# Patient Record
Sex: Female | Born: 1965 | Race: Black or African American | Hispanic: No | Marital: Single
Health system: Midwestern US, Community
[De-identification: ages and names within clinical notes are randomized; demographics above are authoritative.]

## PROBLEM LIST (undated history)

## (undated) DIAGNOSIS — Z1231 Encounter for screening mammogram for malignant neoplasm of breast: Secondary | ICD-10-CM

## (undated) DIAGNOSIS — E0590 Thyrotoxicosis, unspecified without thyrotoxic crisis or storm: Secondary | ICD-10-CM

## (undated) DIAGNOSIS — F332 Major depressive disorder, recurrent severe without psychotic features: Secondary | ICD-10-CM

## (undated) DIAGNOSIS — G4700 Insomnia, unspecified: Secondary | ICD-10-CM

## (undated) DIAGNOSIS — F333 Major depressive disorder, recurrent, severe with psychotic symptoms: Secondary | ICD-10-CM

## (undated) DIAGNOSIS — M25569 Pain in unspecified knee: Secondary | ICD-10-CM

## (undated) DIAGNOSIS — Z1239 Encounter for other screening for malignant neoplasm of breast: Secondary | ICD-10-CM

---

## 1998-02-19 ENCOUNTER — Encounter: Admission: RE | Admit: 1998-02-19 | Discharge: 1998-02-19 | Payer: Self-pay | Admitting: Family Medicine

## 1998-04-24 ENCOUNTER — Inpatient Hospital Stay (HOSPITAL_COMMUNITY): Admission: EM | Admit: 1998-04-24 | Discharge: 1998-04-30 | Payer: Self-pay | Admitting: Emergency Medicine

## 1998-06-11 ENCOUNTER — Encounter: Admission: RE | Admit: 1998-06-11 | Discharge: 1998-09-09 | Payer: Self-pay | Admitting: Specialist

## 1998-07-10 ENCOUNTER — Encounter: Admission: RE | Admit: 1998-07-10 | Discharge: 1998-07-10 | Payer: Self-pay | Admitting: Family Medicine

## 1998-10-08 ENCOUNTER — Encounter: Admission: RE | Admit: 1998-10-08 | Discharge: 1998-10-08 | Payer: Self-pay | Admitting: Family Medicine

## 1998-12-01 ENCOUNTER — Encounter: Admission: RE | Admit: 1998-12-01 | Discharge: 1998-12-01 | Payer: Self-pay | Admitting: Sports Medicine

## 1998-12-16 ENCOUNTER — Encounter: Admission: RE | Admit: 1998-12-16 | Discharge: 1998-12-16 | Payer: Self-pay | Admitting: Family Medicine

## 1998-12-23 ENCOUNTER — Encounter: Admission: RE | Admit: 1998-12-23 | Discharge: 1998-12-23 | Payer: Self-pay | Admitting: Family Medicine

## 1999-01-03 ENCOUNTER — Emergency Department (HOSPITAL_COMMUNITY): Admission: EM | Admit: 1999-01-03 | Discharge: 1999-01-03 | Payer: Self-pay | Admitting: Emergency Medicine

## 1999-01-14 ENCOUNTER — Encounter: Admission: RE | Admit: 1999-01-14 | Discharge: 1999-01-14 | Payer: Self-pay | Admitting: Unknown Physician Specialty

## 1999-01-15 ENCOUNTER — Encounter: Admission: RE | Admit: 1999-01-15 | Discharge: 1999-01-15 | Payer: Self-pay | Admitting: Family Medicine

## 1999-02-03 ENCOUNTER — Encounter: Admission: RE | Admit: 1999-02-03 | Discharge: 1999-02-03 | Payer: Self-pay | Admitting: Family Medicine

## 1999-02-23 ENCOUNTER — Encounter: Admission: RE | Admit: 1999-02-23 | Discharge: 1999-02-23 | Payer: Self-pay | Admitting: Sports Medicine

## 1999-03-05 ENCOUNTER — Encounter: Admission: RE | Admit: 1999-03-05 | Discharge: 1999-03-05 | Payer: Self-pay | Admitting: Family Medicine

## 1999-03-24 ENCOUNTER — Encounter: Admission: RE | Admit: 1999-03-24 | Discharge: 1999-03-24 | Payer: Self-pay | Admitting: Family Medicine

## 1999-05-21 ENCOUNTER — Encounter: Admission: RE | Admit: 1999-05-21 | Discharge: 1999-05-21 | Payer: Self-pay | Admitting: Family Medicine

## 1999-06-02 ENCOUNTER — Encounter: Admission: RE | Admit: 1999-06-02 | Discharge: 1999-06-02 | Payer: Self-pay | Admitting: Family Medicine

## 1999-06-14 ENCOUNTER — Encounter: Admission: RE | Admit: 1999-06-14 | Discharge: 1999-06-14 | Payer: Self-pay | Admitting: Family Medicine

## 1999-08-19 ENCOUNTER — Encounter: Admission: RE | Admit: 1999-08-19 | Discharge: 1999-08-19 | Payer: Self-pay | Admitting: Family Medicine

## 1999-08-20 ENCOUNTER — Encounter: Admission: RE | Admit: 1999-08-20 | Discharge: 1999-08-20 | Payer: Self-pay | Admitting: Family Medicine

## 1999-11-05 ENCOUNTER — Encounter: Admission: RE | Admit: 1999-11-05 | Discharge: 1999-11-05 | Payer: Self-pay | Admitting: Family Medicine

## 1999-11-05 ENCOUNTER — Other Ambulatory Visit: Admission: RE | Admit: 1999-11-05 | Discharge: 1999-11-05 | Payer: Self-pay | Admitting: Family Medicine

## 1999-11-12 ENCOUNTER — Encounter: Admission: RE | Admit: 1999-11-12 | Discharge: 1999-11-12 | Payer: Self-pay | Admitting: Family Medicine

## 1999-12-16 ENCOUNTER — Emergency Department (HOSPITAL_COMMUNITY): Admission: EM | Admit: 1999-12-16 | Discharge: 1999-12-16 | Payer: Self-pay | Admitting: Emergency Medicine

## 2000-02-12 ENCOUNTER — Inpatient Hospital Stay (HOSPITAL_COMMUNITY): Admission: AD | Admit: 2000-02-12 | Discharge: 2000-02-12 | Payer: Self-pay | Admitting: *Deleted

## 2000-04-16 ENCOUNTER — Emergency Department (HOSPITAL_COMMUNITY): Admission: EM | Admit: 2000-04-16 | Discharge: 2000-04-16 | Payer: Self-pay | Admitting: Emergency Medicine

## 2000-11-22 ENCOUNTER — Encounter: Admission: RE | Admit: 2000-11-22 | Discharge: 2000-11-22 | Payer: Self-pay | Admitting: Family Medicine

## 2001-02-21 ENCOUNTER — Encounter: Admission: RE | Admit: 2001-02-21 | Discharge: 2001-02-21 | Payer: Self-pay | Admitting: Family Medicine

## 2001-03-27 ENCOUNTER — Encounter: Admission: RE | Admit: 2001-03-27 | Discharge: 2001-03-27 | Payer: Self-pay | Admitting: Family Medicine

## 2001-03-27 ENCOUNTER — Other Ambulatory Visit: Admission: RE | Admit: 2001-03-27 | Discharge: 2001-03-27 | Payer: Self-pay | Admitting: *Deleted

## 2001-04-25 ENCOUNTER — Emergency Department (HOSPITAL_COMMUNITY): Admission: EM | Admit: 2001-04-25 | Discharge: 2001-04-25 | Payer: Self-pay | Admitting: Emergency Medicine

## 2001-05-23 ENCOUNTER — Encounter: Admission: RE | Admit: 2001-05-23 | Discharge: 2001-05-23 | Payer: Self-pay | Admitting: Family Medicine

## 2001-08-20 ENCOUNTER — Encounter: Admission: RE | Admit: 2001-08-20 | Discharge: 2001-08-20 | Payer: Self-pay | Admitting: Family Medicine

## 2001-08-31 ENCOUNTER — Encounter: Admission: RE | Admit: 2001-08-31 | Discharge: 2001-08-31 | Payer: Self-pay | Admitting: Family Medicine

## 2001-09-14 ENCOUNTER — Observation Stay (HOSPITAL_COMMUNITY): Admission: EM | Admit: 2001-09-14 | Discharge: 2001-09-14 | Payer: Self-pay

## 2001-09-14 ENCOUNTER — Encounter: Payer: Self-pay | Admitting: Emergency Medicine

## 2001-09-20 ENCOUNTER — Encounter: Admission: RE | Admit: 2001-09-20 | Discharge: 2001-09-20 | Payer: Self-pay | Admitting: Family Medicine

## 2001-10-08 ENCOUNTER — Encounter: Admission: RE | Admit: 2001-10-08 | Discharge: 2001-10-08 | Payer: Self-pay | Admitting: Family Medicine

## 2001-10-16 ENCOUNTER — Encounter: Admission: RE | Admit: 2001-10-16 | Discharge: 2001-10-16 | Payer: Self-pay | Admitting: Sports Medicine

## 2001-10-16 ENCOUNTER — Ambulatory Visit (HOSPITAL_COMMUNITY): Admission: RE | Admit: 2001-10-16 | Discharge: 2001-10-16 | Payer: Self-pay | Admitting: Sports Medicine

## 2001-11-19 ENCOUNTER — Encounter: Admission: RE | Admit: 2001-11-19 | Discharge: 2001-11-19 | Payer: Self-pay | Admitting: Family Medicine

## 2002-02-26 ENCOUNTER — Encounter: Admission: RE | Admit: 2002-02-26 | Discharge: 2002-02-26 | Payer: Self-pay | Admitting: Family Medicine

## 2002-03-07 ENCOUNTER — Emergency Department (HOSPITAL_COMMUNITY): Admission: EM | Admit: 2002-03-07 | Discharge: 2002-03-08 | Payer: Self-pay

## 2002-03-19 ENCOUNTER — Emergency Department (HOSPITAL_COMMUNITY): Admission: EM | Admit: 2002-03-19 | Discharge: 2002-03-19 | Payer: Self-pay | Admitting: Emergency Medicine

## 2002-05-28 ENCOUNTER — Encounter: Admission: RE | Admit: 2002-05-28 | Discharge: 2002-05-28 | Payer: Self-pay | Admitting: Family Medicine

## 2002-06-14 ENCOUNTER — Other Ambulatory Visit: Admission: RE | Admit: 2002-06-14 | Discharge: 2002-06-14 | Payer: Self-pay | Admitting: *Deleted

## 2002-06-14 ENCOUNTER — Encounter: Admission: RE | Admit: 2002-06-14 | Discharge: 2002-06-14 | Payer: Self-pay | Admitting: Family Medicine

## 2002-06-18 ENCOUNTER — Encounter: Payer: Self-pay | Admitting: Sports Medicine

## 2002-06-18 ENCOUNTER — Encounter: Admission: RE | Admit: 2002-06-18 | Discharge: 2002-06-18 | Payer: Self-pay | Admitting: Sports Medicine

## 2002-06-19 ENCOUNTER — Encounter: Admission: RE | Admit: 2002-06-19 | Discharge: 2002-06-19 | Payer: Self-pay | Admitting: Family Medicine

## 2002-07-08 ENCOUNTER — Encounter: Admission: RE | Admit: 2002-07-08 | Discharge: 2002-08-19 | Payer: Self-pay | Admitting: Specialist

## 2002-07-18 ENCOUNTER — Encounter: Admission: RE | Admit: 2002-07-18 | Discharge: 2002-07-18 | Payer: Self-pay | Admitting: Family Medicine

## 2002-08-29 ENCOUNTER — Encounter: Admission: RE | Admit: 2002-08-29 | Discharge: 2002-08-29 | Payer: Self-pay | Admitting: Family Medicine

## 2003-04-02 ENCOUNTER — Inpatient Hospital Stay (HOSPITAL_COMMUNITY): Admission: AD | Admit: 2003-04-02 | Discharge: 2003-04-03 | Payer: Self-pay | Admitting: Obstetrics and Gynecology

## 2003-04-06 ENCOUNTER — Inpatient Hospital Stay (HOSPITAL_COMMUNITY): Admission: AD | Admit: 2003-04-06 | Discharge: 2003-04-06 | Payer: Self-pay | Admitting: Family Medicine

## 2003-04-13 ENCOUNTER — Inpatient Hospital Stay (HOSPITAL_COMMUNITY): Admission: AD | Admit: 2003-04-13 | Discharge: 2003-04-13 | Payer: Self-pay | Admitting: Obstetrics & Gynecology

## 2003-04-21 ENCOUNTER — Inpatient Hospital Stay (HOSPITAL_COMMUNITY): Admission: AD | Admit: 2003-04-21 | Discharge: 2003-04-21 | Payer: Self-pay | Admitting: Obstetrics & Gynecology

## 2003-06-09 ENCOUNTER — Emergency Department (HOSPITAL_COMMUNITY): Admission: EM | Admit: 2003-06-09 | Discharge: 2003-06-10 | Payer: Self-pay | Admitting: Emergency Medicine

## 2003-06-10 ENCOUNTER — Encounter: Payer: Self-pay | Admitting: Emergency Medicine

## 2003-06-15 ENCOUNTER — Emergency Department (HOSPITAL_COMMUNITY): Admission: EM | Admit: 2003-06-15 | Discharge: 2003-06-15 | Payer: Self-pay | Admitting: Emergency Medicine

## 2003-06-20 ENCOUNTER — Encounter: Admission: RE | Admit: 2003-06-20 | Discharge: 2003-06-20 | Payer: Self-pay | Admitting: Family Medicine

## 2003-06-20 ENCOUNTER — Encounter (INDEPENDENT_AMBULATORY_CARE_PROVIDER_SITE_OTHER): Payer: Self-pay | Admitting: *Deleted

## 2003-06-27 ENCOUNTER — Encounter: Admission: RE | Admit: 2003-06-27 | Discharge: 2003-06-27 | Payer: Self-pay | Admitting: Family Medicine

## 2003-06-27 ENCOUNTER — Other Ambulatory Visit: Admission: RE | Admit: 2003-06-27 | Discharge: 2003-06-27 | Payer: Self-pay | Admitting: Family Medicine

## 2003-07-07 ENCOUNTER — Ambulatory Visit (HOSPITAL_COMMUNITY): Admission: RE | Admit: 2003-07-07 | Discharge: 2003-07-07 | Payer: Self-pay | Admitting: Family Medicine

## 2003-07-07 ENCOUNTER — Encounter: Admission: RE | Admit: 2003-07-07 | Discharge: 2003-07-07 | Payer: Self-pay | Admitting: Family Medicine

## 2003-07-07 ENCOUNTER — Encounter: Payer: Self-pay | Admitting: Family Medicine

## 2003-07-07 ENCOUNTER — Encounter (INDEPENDENT_AMBULATORY_CARE_PROVIDER_SITE_OTHER): Payer: Self-pay | Admitting: *Deleted

## 2003-07-07 LAB — CONVERTED CEMR LAB
CD4 Count: 730 microliters
CD4 T Cell Abs: 730

## 2003-07-15 ENCOUNTER — Encounter: Admission: RE | Admit: 2003-07-15 | Discharge: 2003-07-15 | Payer: Self-pay | Admitting: Family Medicine

## 2003-07-21 ENCOUNTER — Ambulatory Visit (HOSPITAL_COMMUNITY): Admission: RE | Admit: 2003-07-21 | Discharge: 2003-07-21 | Payer: Self-pay | Admitting: Family Medicine

## 2003-08-12 ENCOUNTER — Encounter: Admission: RE | Admit: 2003-08-12 | Discharge: 2003-08-12 | Payer: Self-pay | Admitting: Family Medicine

## 2003-08-18 ENCOUNTER — Encounter (INDEPENDENT_AMBULATORY_CARE_PROVIDER_SITE_OTHER): Payer: Self-pay | Admitting: Infectious Diseases

## 2003-08-18 ENCOUNTER — Ambulatory Visit (HOSPITAL_COMMUNITY): Admission: RE | Admit: 2003-08-18 | Discharge: 2003-08-18 | Payer: Self-pay | Admitting: Infectious Diseases

## 2003-08-18 ENCOUNTER — Encounter: Admission: RE | Admit: 2003-08-18 | Discharge: 2003-08-18 | Payer: Self-pay | Admitting: Infectious Diseases

## 2003-09-01 ENCOUNTER — Encounter: Admission: RE | Admit: 2003-09-01 | Discharge: 2003-09-01 | Payer: Self-pay | Admitting: Infectious Diseases

## 2003-09-10 ENCOUNTER — Encounter: Admission: RE | Admit: 2003-09-10 | Discharge: 2003-09-10 | Payer: Self-pay | Admitting: Sports Medicine

## 2003-09-16 ENCOUNTER — Ambulatory Visit (HOSPITAL_COMMUNITY): Admission: RE | Admit: 2003-09-16 | Discharge: 2003-09-16 | Payer: Self-pay | Admitting: Family Medicine

## 2003-10-01 ENCOUNTER — Encounter: Admission: RE | Admit: 2003-10-01 | Discharge: 2003-10-01 | Payer: Self-pay | Admitting: Infectious Diseases

## 2003-10-08 ENCOUNTER — Encounter: Admission: RE | Admit: 2003-10-08 | Discharge: 2003-10-08 | Payer: Self-pay | Admitting: Family Medicine

## 2003-10-20 ENCOUNTER — Encounter: Admission: RE | Admit: 2003-10-20 | Discharge: 2003-10-20 | Payer: Self-pay | Admitting: Infectious Diseases

## 2003-11-01 ENCOUNTER — Inpatient Hospital Stay (HOSPITAL_COMMUNITY): Admission: AD | Admit: 2003-11-01 | Discharge: 2003-11-01 | Payer: Self-pay | Admitting: *Deleted

## 2003-11-05 ENCOUNTER — Encounter: Admission: RE | Admit: 2003-11-05 | Discharge: 2003-11-05 | Payer: Self-pay | Admitting: Family Medicine

## 2003-11-06 ENCOUNTER — Encounter: Admission: RE | Admit: 2003-11-06 | Discharge: 2003-11-06 | Payer: Self-pay | Admitting: Sports Medicine

## 2003-11-23 ENCOUNTER — Inpatient Hospital Stay (HOSPITAL_COMMUNITY): Admission: AD | Admit: 2003-11-23 | Discharge: 2003-11-23 | Payer: Self-pay | Admitting: Family Medicine

## 2003-11-27 ENCOUNTER — Encounter: Admission: RE | Admit: 2003-11-27 | Discharge: 2003-11-27 | Payer: Self-pay | Admitting: Family Medicine

## 2003-12-10 ENCOUNTER — Ambulatory Visit (HOSPITAL_COMMUNITY): Admission: RE | Admit: 2003-12-10 | Discharge: 2003-12-10 | Payer: Self-pay | Admitting: Infectious Diseases

## 2003-12-10 ENCOUNTER — Encounter: Admission: RE | Admit: 2003-12-10 | Discharge: 2003-12-10 | Payer: Self-pay | Admitting: Infectious Diseases

## 2003-12-16 ENCOUNTER — Encounter: Admission: RE | Admit: 2003-12-16 | Discharge: 2003-12-16 | Payer: Self-pay | Admitting: Sports Medicine

## 2004-01-01 ENCOUNTER — Encounter: Admission: RE | Admit: 2004-01-01 | Discharge: 2004-01-01 | Payer: Self-pay | Admitting: Family Medicine

## 2004-01-05 ENCOUNTER — Encounter: Admission: RE | Admit: 2004-01-05 | Discharge: 2004-01-05 | Payer: Self-pay | Admitting: Infectious Diseases

## 2004-01-14 ENCOUNTER — Encounter: Admission: RE | Admit: 2004-01-14 | Discharge: 2004-01-14 | Payer: Self-pay | Admitting: Family Medicine

## 2004-01-14 ENCOUNTER — Other Ambulatory Visit: Admission: RE | Admit: 2004-01-14 | Discharge: 2004-01-14 | Payer: Self-pay | Admitting: Family Medicine

## 2004-01-19 ENCOUNTER — Encounter: Admission: RE | Admit: 2004-01-19 | Discharge: 2004-01-19 | Payer: Self-pay | Admitting: Family Medicine

## 2004-01-26 ENCOUNTER — Inpatient Hospital Stay (HOSPITAL_COMMUNITY): Admission: AD | Admit: 2004-01-26 | Discharge: 2004-01-26 | Payer: Self-pay | Admitting: Family Medicine

## 2004-01-27 ENCOUNTER — Encounter: Admission: RE | Admit: 2004-01-27 | Discharge: 2004-01-27 | Payer: Self-pay | Admitting: Family Medicine

## 2004-02-03 ENCOUNTER — Encounter: Admission: RE | Admit: 2004-02-03 | Discharge: 2004-02-03 | Payer: Self-pay | Admitting: Family Medicine

## 2004-02-05 ENCOUNTER — Inpatient Hospital Stay (HOSPITAL_COMMUNITY): Admission: AD | Admit: 2004-02-05 | Discharge: 2004-02-05 | Payer: Self-pay | Admitting: Family Medicine

## 2004-02-07 ENCOUNTER — Inpatient Hospital Stay (HOSPITAL_COMMUNITY): Admission: AD | Admit: 2004-02-07 | Discharge: 2004-02-07 | Payer: Self-pay | Admitting: Obstetrics & Gynecology

## 2004-02-08 ENCOUNTER — Inpatient Hospital Stay (HOSPITAL_COMMUNITY): Admission: AD | Admit: 2004-02-08 | Discharge: 2004-02-11 | Payer: Self-pay | Admitting: Family Medicine

## 2004-03-30 ENCOUNTER — Encounter: Admission: RE | Admit: 2004-03-30 | Discharge: 2004-03-30 | Payer: Self-pay | Admitting: Family Medicine

## 2004-03-30 ENCOUNTER — Other Ambulatory Visit: Admission: RE | Admit: 2004-03-30 | Discharge: 2004-03-30 | Payer: Self-pay | Admitting: Family Medicine

## 2004-04-11 ENCOUNTER — Emergency Department (HOSPITAL_COMMUNITY): Admission: EM | Admit: 2004-04-11 | Discharge: 2004-04-11 | Payer: Self-pay | Admitting: *Deleted

## 2004-04-14 ENCOUNTER — Encounter: Admission: RE | Admit: 2004-04-14 | Discharge: 2004-04-14 | Payer: Self-pay | Admitting: Family Medicine

## 2004-04-16 ENCOUNTER — Encounter: Admission: RE | Admit: 2004-04-16 | Discharge: 2004-04-16 | Payer: Self-pay | Admitting: Sports Medicine

## 2004-06-14 ENCOUNTER — Ambulatory Visit: Payer: Self-pay | Admitting: Family Medicine

## 2004-07-01 ENCOUNTER — Ambulatory Visit: Payer: Self-pay | Admitting: Family Medicine

## 2004-08-03 ENCOUNTER — Ambulatory Visit: Payer: Self-pay | Admitting: Sports Medicine

## 2004-08-05 ENCOUNTER — Ambulatory Visit (HOSPITAL_COMMUNITY): Admission: RE | Admit: 2004-08-05 | Discharge: 2004-08-05 | Payer: Self-pay | Admitting: Infectious Diseases

## 2004-08-05 ENCOUNTER — Ambulatory Visit: Payer: Self-pay | Admitting: Infectious Diseases

## 2004-08-23 ENCOUNTER — Ambulatory Visit: Payer: Self-pay | Admitting: Infectious Diseases

## 2004-09-28 ENCOUNTER — Ambulatory Visit: Payer: Self-pay | Admitting: Family Medicine

## 2004-10-29 ENCOUNTER — Ambulatory Visit: Payer: Self-pay | Admitting: Family Medicine

## 2004-11-30 ENCOUNTER — Ambulatory Visit: Payer: Self-pay | Admitting: Family Medicine

## 2005-01-17 ENCOUNTER — Ambulatory Visit: Payer: Self-pay | Admitting: Infectious Diseases

## 2005-01-17 ENCOUNTER — Ambulatory Visit (HOSPITAL_COMMUNITY): Admission: RE | Admit: 2005-01-17 | Discharge: 2005-01-17 | Payer: Self-pay | Admitting: Infectious Diseases

## 2005-01-31 ENCOUNTER — Ambulatory Visit: Payer: Self-pay | Admitting: Infectious Diseases

## 2005-03-07 ENCOUNTER — Ambulatory Visit: Payer: Self-pay | Admitting: Infectious Diseases

## 2005-03-24 ENCOUNTER — Ambulatory Visit: Payer: Self-pay | Admitting: Family Medicine

## 2005-04-05 IMAGING — US US TRANSVAGINAL NON-OB
1 series · 14 of 25 positions shown · non-contrast
Comparison: none

CLINICAL DATA: Pelvic pain.  Eight weeks postpartum. 
 ULTRASOUND OF THE PELVIS 
 Transabdominal and transvaginal ultrasound of the pelvis were performed.  The uterus is slightly prominent measuring 10.6 cm sagittally in this post-partum patient with a depth of 3.8 cm and width of 6.7 cm.  The endometrium measures 9.5 mm in thickness and is homogeneously echodense.  Small nabothian cysts are noted.  There are follicles in both ovaries.  The right ovary measures 2.4 x 1.4 x 1.7 cm, with the left ovary measuring 2.6 x 1.4 x 1.9 cm.  There is a 1.9 x 1.2 x 1.2 area of mixed echogenicity in the left ovary probably representing a collapsing cyst.  A small amount of free fluid is noted.  
 IMPRESSION
 Ovarian follicles bilaterally with probable collapsing cyst on the left.  Small amount of free fluid.

[Series 1: unknown · 0.26mm/px · 14 of 53 slices shown]
[im 1/53]
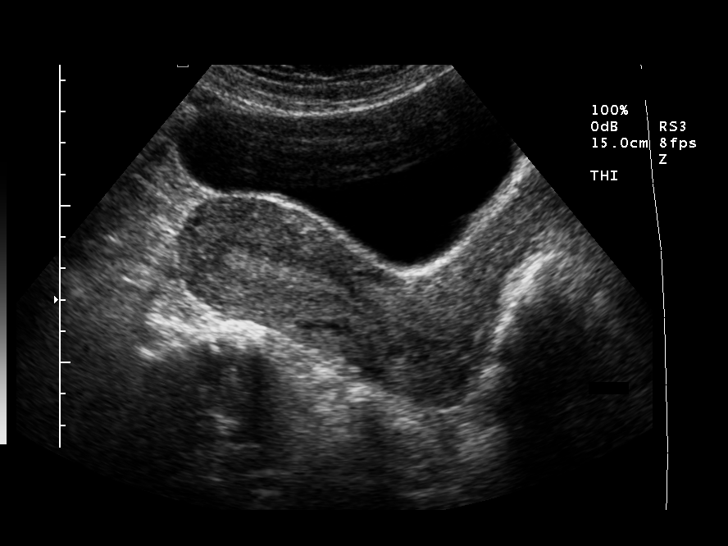
[im 5/53]
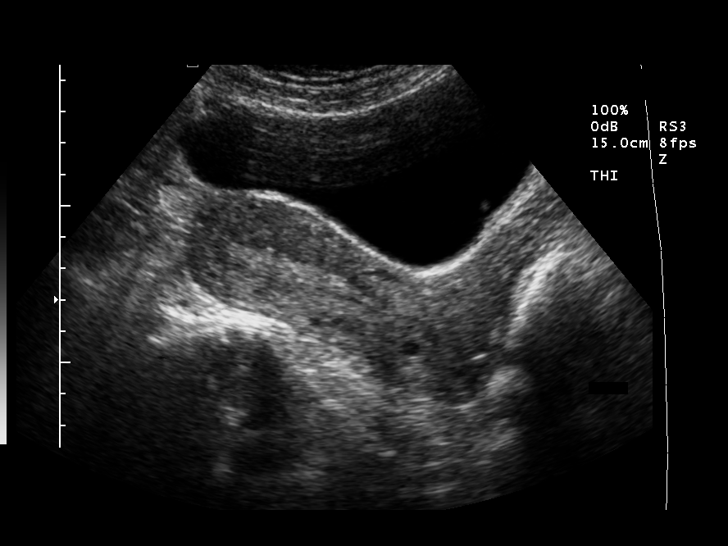
[im 9/53]
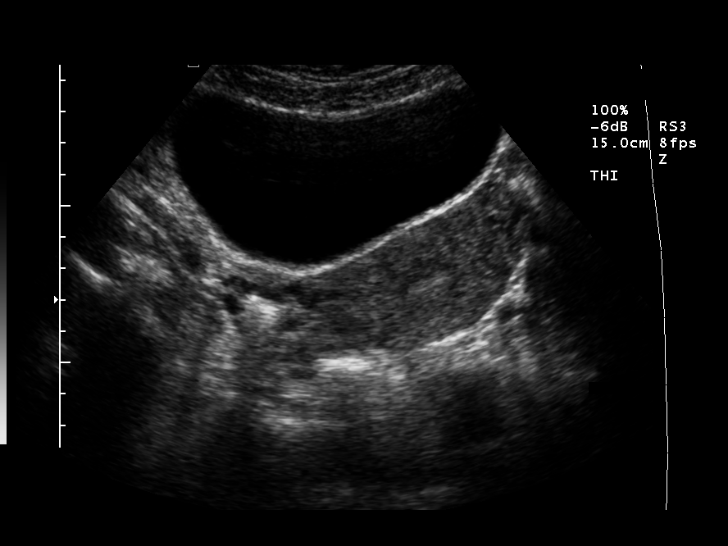
[im 14/53]
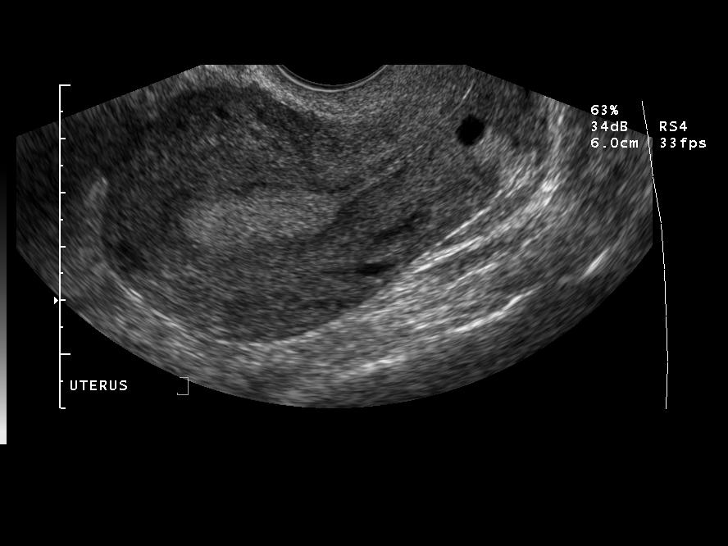
[im 18/53]
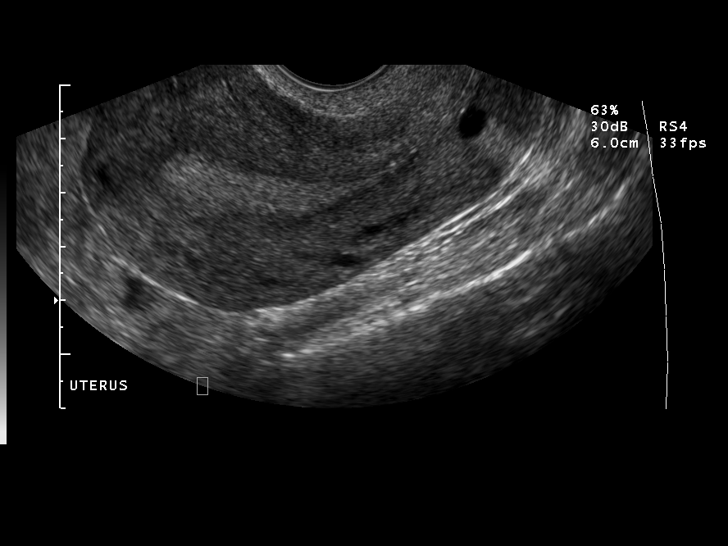
[im 20/53]
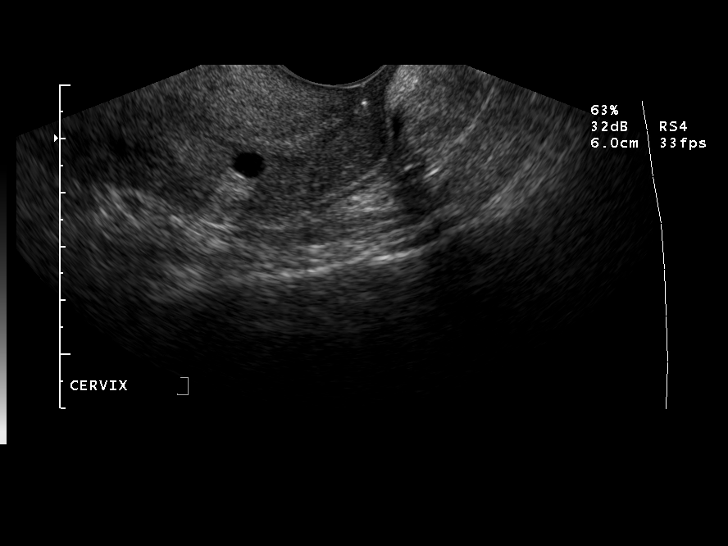
[im 24/53]
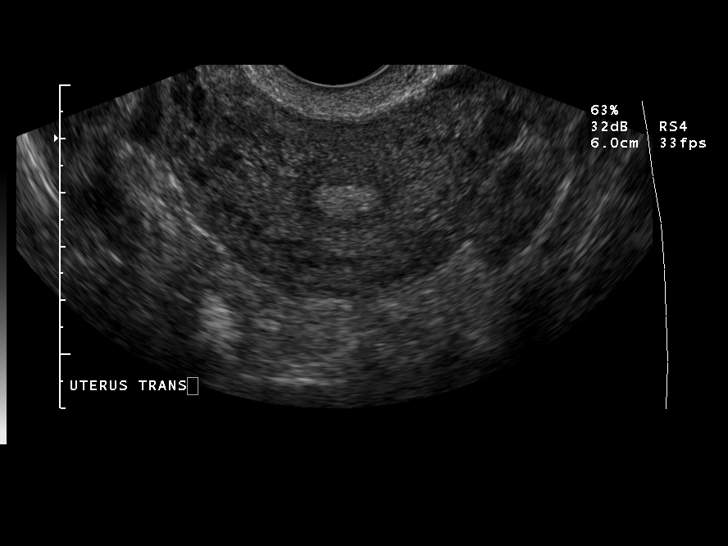
[im 29/53]
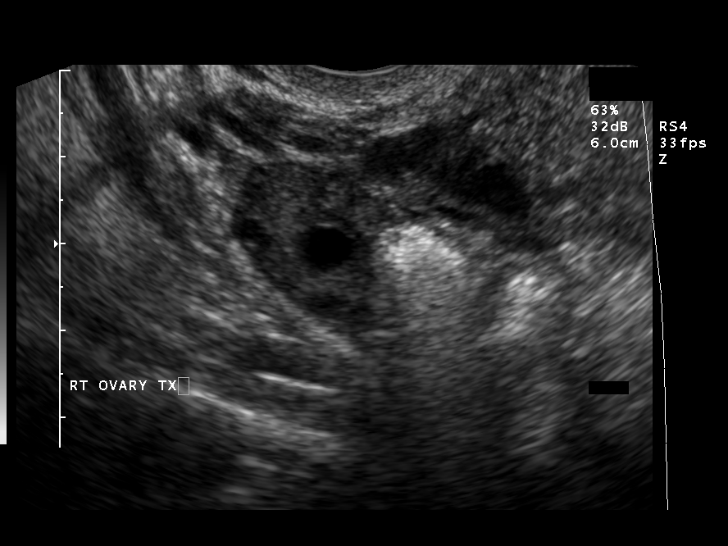
[im 33/53]
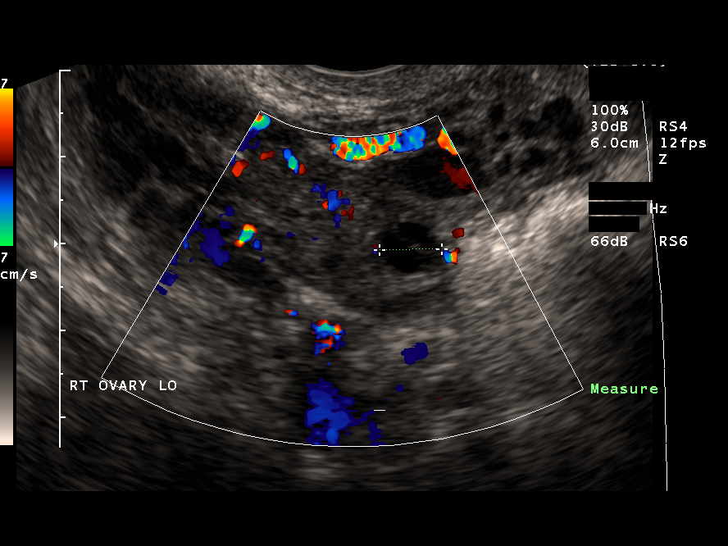
[im 35/53]
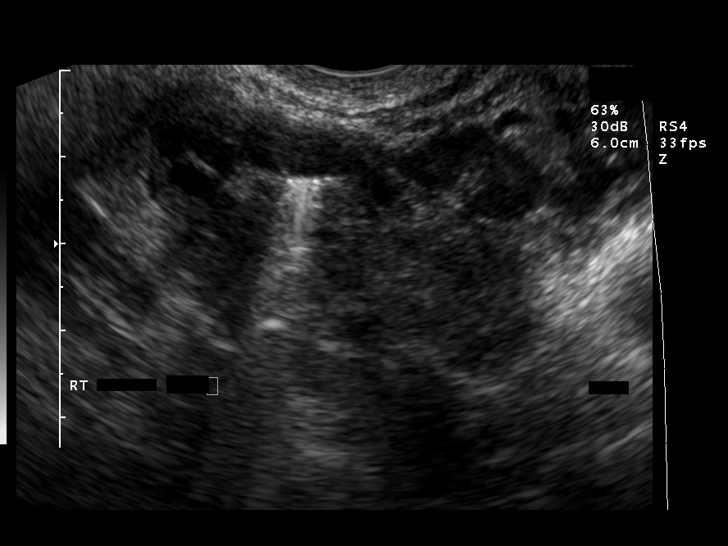
[im 40/53]
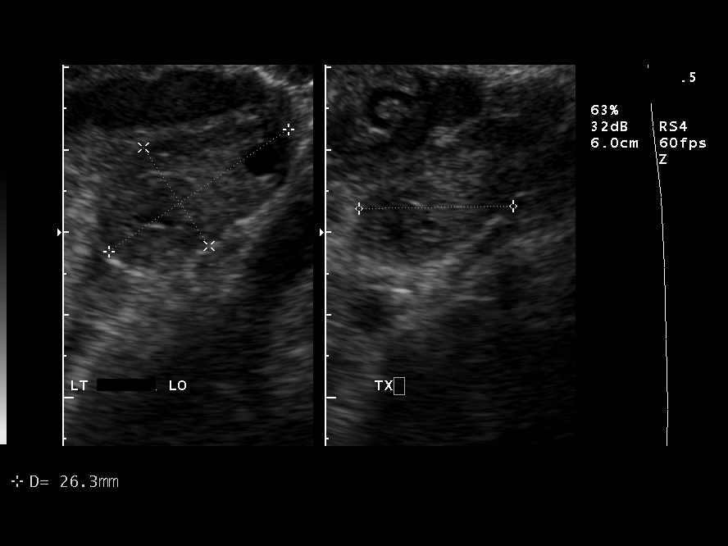
[im 44/53]
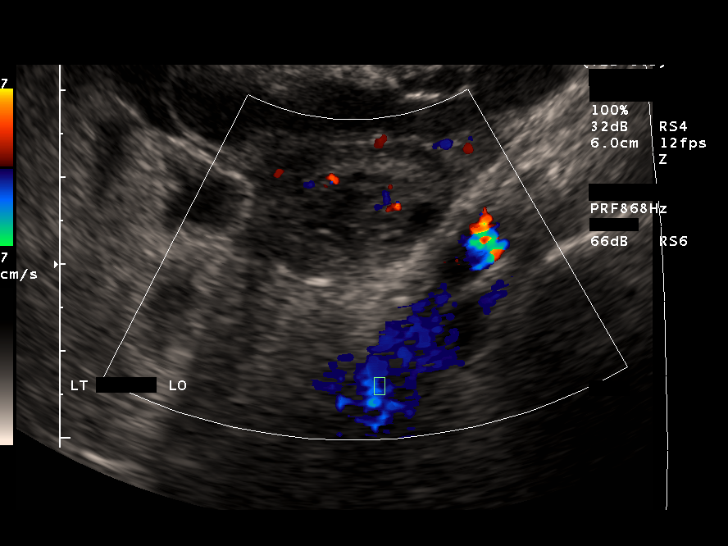
[im 48/53]
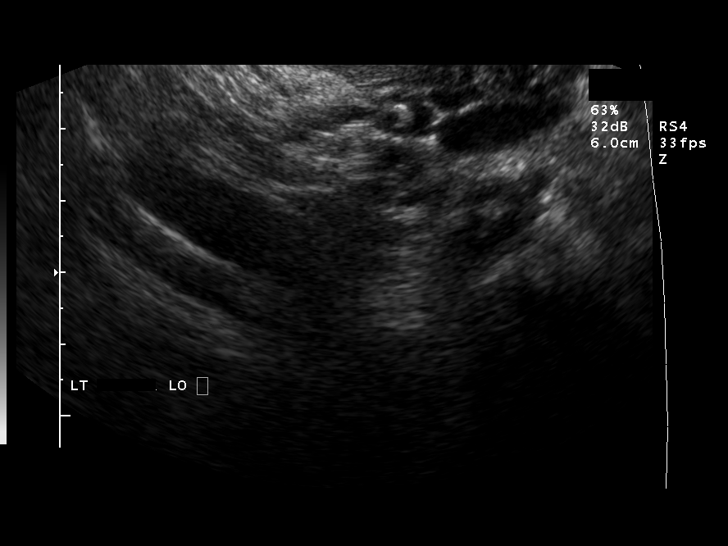
[im 53/53]
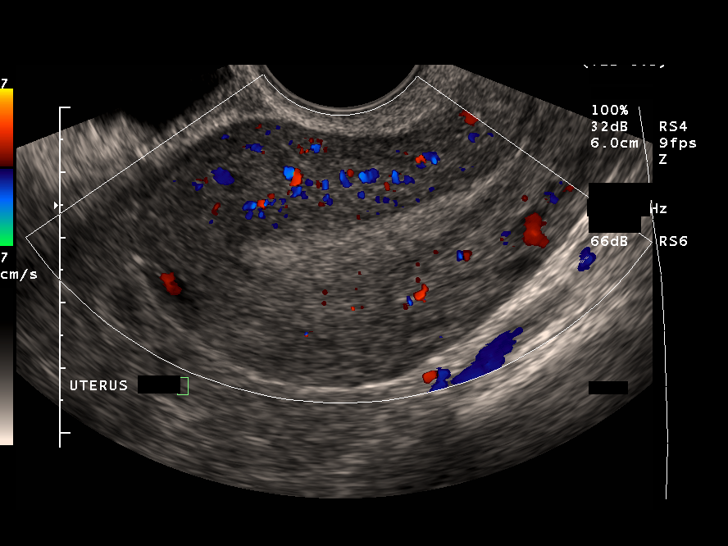

[14 of 25 positions shown; findings below may reference images not displayed]

## 2005-07-13 ENCOUNTER — Ambulatory Visit: Payer: Self-pay | Admitting: Family Medicine

## 2005-07-13 ENCOUNTER — Other Ambulatory Visit: Admission: RE | Admit: 2005-07-13 | Discharge: 2005-07-13 | Payer: Self-pay | Admitting: Family Medicine

## 2005-08-15 ENCOUNTER — Ambulatory Visit (HOSPITAL_COMMUNITY): Admission: RE | Admit: 2005-08-15 | Discharge: 2005-08-15 | Payer: Self-pay | Admitting: Infectious Diseases

## 2005-08-15 ENCOUNTER — Encounter (INDEPENDENT_AMBULATORY_CARE_PROVIDER_SITE_OTHER): Payer: Self-pay | Admitting: *Deleted

## 2005-08-15 ENCOUNTER — Ambulatory Visit: Payer: Self-pay | Admitting: Infectious Diseases

## 2005-08-15 LAB — CONVERTED CEMR LAB: HIV 1 RNA Quant: 49 copies/mL

## 2005-08-31 ENCOUNTER — Ambulatory Visit: Payer: Self-pay | Admitting: Infectious Diseases

## 2005-09-23 ENCOUNTER — Ambulatory Visit: Payer: Self-pay | Admitting: Family Medicine

## 2005-10-20 ENCOUNTER — Ambulatory Visit: Payer: Self-pay | Admitting: Family Medicine

## 2006-01-04 ENCOUNTER — Encounter (INDEPENDENT_AMBULATORY_CARE_PROVIDER_SITE_OTHER): Payer: Self-pay | Admitting: *Deleted

## 2006-01-04 ENCOUNTER — Encounter: Admission: RE | Admit: 2006-01-04 | Discharge: 2006-01-04 | Payer: Self-pay | Admitting: Infectious Diseases

## 2006-01-04 ENCOUNTER — Ambulatory Visit: Payer: Self-pay | Admitting: Infectious Diseases

## 2006-01-04 LAB — CONVERTED CEMR LAB: HIV 1 RNA Quant: 49 copies/mL

## 2006-03-06 ENCOUNTER — Ambulatory Visit: Payer: Self-pay | Admitting: Infectious Diseases

## 2006-08-04 ENCOUNTER — Encounter (INDEPENDENT_AMBULATORY_CARE_PROVIDER_SITE_OTHER): Payer: Self-pay | Admitting: *Deleted

## 2006-08-04 ENCOUNTER — Encounter: Admission: RE | Admit: 2006-08-04 | Discharge: 2006-08-04 | Payer: Self-pay | Admitting: Infectious Diseases

## 2006-08-04 ENCOUNTER — Ambulatory Visit: Payer: Self-pay | Admitting: Infectious Diseases

## 2006-08-04 LAB — CONVERTED CEMR LAB
ALT: 16 units/L (ref 0–35)
Alkaline Phosphatase: 36 units/L — ABNORMAL LOW (ref 39–117)
Basophils Absolute: 0 10*3/uL (ref 0.0–0.1)
Basophils Relative: 0 % (ref 0–1)
CO2: 29 meq/L (ref 19–32)
Chloride: 105 meq/L (ref 96–112)
Creatinine, Ser: 0.84 mg/dL (ref 0.40–1.20)
Glucose, Bld: 84 mg/dL (ref 70–99)
HCT: 39.7 % (ref 34.4–43.3)
HIV 1 RNA Quant: <50 copies/mL (ref <50)
Hemoglobin: 12.9 g/dL (ref 11.7–14.8)
MCV: 92.5 fL (ref 78.8–100.0)
Monocytes Absolute: 0.4 10*3/uL (ref 0.2–0.7)
Neutro Abs: 1.9 10*3/uL (ref 1.8–6.8)
Neutrophils Relative %: 38 % — ABNORMAL LOW (ref 47–77)
Potassium: 4.3 meq/L (ref 3.5–5.3)
Total Bilirubin: 0.3 mg/dL (ref 0.3–1.2)
Total Protein: 6.7 g/dL (ref 6.0–8.3)

## 2006-08-21 ENCOUNTER — Encounter (INDEPENDENT_AMBULATORY_CARE_PROVIDER_SITE_OTHER): Payer: Self-pay | Admitting: Infectious Diseases

## 2006-11-13 ENCOUNTER — Encounter (INDEPENDENT_AMBULATORY_CARE_PROVIDER_SITE_OTHER): Payer: Self-pay | Admitting: *Deleted

## 2006-11-13 LAB — CONVERTED CEMR LAB

## 2006-11-17 ENCOUNTER — Encounter (INDEPENDENT_AMBULATORY_CARE_PROVIDER_SITE_OTHER): Payer: Self-pay | Admitting: *Deleted

## 2006-11-26 ENCOUNTER — Encounter (INDEPENDENT_AMBULATORY_CARE_PROVIDER_SITE_OTHER): Payer: Self-pay | Admitting: *Deleted

## 2006-12-14 LAB — CONVERTED CEMR LAB: Pap Smear: NORMAL

## 2006-12-15 ENCOUNTER — Ambulatory Visit: Payer: Self-pay | Admitting: Infectious Diseases

## 2006-12-15 ENCOUNTER — Encounter (INDEPENDENT_AMBULATORY_CARE_PROVIDER_SITE_OTHER): Payer: Self-pay | Admitting: *Deleted

## 2006-12-15 DIAGNOSIS — I10 Essential (primary) hypertension: Secondary | ICD-10-CM | POA: Insufficient documentation

## 2006-12-15 DIAGNOSIS — B20 Human immunodeficiency virus [HIV] disease: Secondary | ICD-10-CM | POA: Insufficient documentation

## 2006-12-15 DIAGNOSIS — Z9889 Other specified postprocedural states: Secondary | ICD-10-CM

## 2006-12-15 LAB — CONVERTED CEMR LAB

## 2006-12-18 ENCOUNTER — Encounter (INDEPENDENT_AMBULATORY_CARE_PROVIDER_SITE_OTHER): Payer: Self-pay | Admitting: Infectious Diseases

## 2006-12-18 LAB — CONVERTED CEMR LAB: Pap Smear: NORMAL

## 2007-08-13 ENCOUNTER — Ambulatory Visit: Payer: Self-pay | Admitting: Family Medicine

## 2007-08-13 DIAGNOSIS — G43009 Migraine without aura, not intractable, without status migrainosus: Secondary | ICD-10-CM | POA: Insufficient documentation

## 2007-08-13 DIAGNOSIS — F3289 Other specified depressive episodes: Secondary | ICD-10-CM | POA: Insufficient documentation

## 2007-08-13 DIAGNOSIS — F329 Major depressive disorder, single episode, unspecified: Secondary | ICD-10-CM

## 2007-08-13 DIAGNOSIS — A5901 Trichomonal vulvovaginitis: Secondary | ICD-10-CM | POA: Insufficient documentation

## 2007-08-13 DIAGNOSIS — N898 Other specified noninflammatory disorders of vagina: Secondary | ICD-10-CM | POA: Insufficient documentation

## 2007-08-13 LAB — CONVERTED CEMR LAB: Whiff Test: POSITIVE

## 2007-08-27 ENCOUNTER — Ambulatory Visit: Payer: Self-pay | Admitting: Family Medicine

## 2007-08-28 ENCOUNTER — Encounter: Payer: Self-pay | Admitting: Family Medicine

## 2007-08-28 LAB — CONVERTED CEMR LAB
ALT: 12 units/L (ref 0–35)
AST: 14 units/L (ref 0–37)
Absolute CD4: 1007 #/uL (ref 381–1469)
Alkaline Phosphatase: 39 units/L (ref 39–117)
Chloride: 106 meq/L (ref 96–112)
Creatinine, Ser: 1.13 mg/dL (ref 0.40–1.20)
HIV 1 RNA Quant: <50 copies/mL (ref <50)
MCHC: 32.8 g/dL (ref 30.0–36.0)
RBC: 4.44 M/uL (ref 3.87–5.11)
Sodium: 139 meq/L (ref 135–145)
Total lymphocyte count: 2397 cells/mcL (ref 700–3300)
WBC, lymph enumeration: 5.1 10*3/uL (ref 4.0–10.5)
WBC: 5.1 10*3/uL (ref 4.0–10.5)

## 2007-08-31 ENCOUNTER — Encounter: Payer: Self-pay | Admitting: Family Medicine

## 2007-10-16 ENCOUNTER — Encounter (INDEPENDENT_AMBULATORY_CARE_PROVIDER_SITE_OTHER): Payer: Self-pay | Admitting: *Deleted

## 2007-11-08 ENCOUNTER — Encounter (INDEPENDENT_AMBULATORY_CARE_PROVIDER_SITE_OTHER): Payer: Self-pay | Admitting: *Deleted

## 2007-11-14 ENCOUNTER — Ambulatory Visit: Payer: Self-pay | Admitting: Infectious Disease

## 2007-11-14 ENCOUNTER — Encounter: Admission: RE | Admit: 2007-11-14 | Discharge: 2007-11-14 | Payer: Self-pay | Admitting: Infectious Disease

## 2007-11-14 LAB — CONVERTED CEMR LAB
ALT: 16 units/L (ref 0–35)
Alkaline Phosphatase: 39 units/L (ref 39–117)
BUN: 9 mg/dL (ref 6–23)
Bacteria, UA: NONE SEEN
Basophils Absolute: 0 10*3/uL (ref 0.0–0.1)
CO2: 24 meq/L (ref 19–32)
Calcium: 8.9 mg/dL (ref 8.4–10.5)
Creatinine, Ser: 0.87 mg/dL (ref 0.40–1.20)
Eosinophils Absolute: 0.1 10*3/uL (ref 0.0–0.7)
HIV 1 RNA Quant: <50 copies/mL (ref <50)
Ketones, ur: NEGATIVE mg/dL
Lymphs Abs: 2.1 10*3/uL (ref 0.7–4.0)
Monocytes Relative: 8 % (ref 3–12)
Nitrite: NEGATIVE
Platelets: 433 10*3/uL — ABNORMAL HIGH (ref 150–400)
Potassium: 4.1 meq/L (ref 3.5–5.3)
Sodium: 144 meq/L (ref 135–145)
Urine Glucose: NEGATIVE mg/dL
Urobilinogen, UA: 0.2 (ref 0.0–1.0)
WBC: 4.6 10*3/uL (ref 4.0–10.5)

## 2007-11-16 ENCOUNTER — Encounter (INDEPENDENT_AMBULATORY_CARE_PROVIDER_SITE_OTHER): Payer: Self-pay | Admitting: *Deleted

## 2007-11-23 ENCOUNTER — Encounter (INDEPENDENT_AMBULATORY_CARE_PROVIDER_SITE_OTHER): Payer: Self-pay | Admitting: *Deleted

## 2007-11-26 ENCOUNTER — Ambulatory Visit: Payer: Self-pay | Admitting: Infectious Disease

## 2007-11-26 LAB — CONVERTED CEMR LAB
Anti Nuclear Antibody(ANA): NEGATIVE
HIV-2 Ab: NEGATIVE
HIV: REACTIVE

## 2007-12-14 ENCOUNTER — Ambulatory Visit: Payer: Self-pay | Admitting: Internal Medicine

## 2007-12-14 ENCOUNTER — Encounter: Payer: Self-pay | Admitting: Internal Medicine

## 2007-12-21 ENCOUNTER — Encounter: Payer: Self-pay | Admitting: Infectious Disease

## 2008-02-09 ENCOUNTER — Encounter (INDEPENDENT_AMBULATORY_CARE_PROVIDER_SITE_OTHER): Payer: Self-pay | Admitting: Emergency Medicine

## 2008-02-09 ENCOUNTER — Emergency Department (HOSPITAL_COMMUNITY): Admission: EM | Admit: 2008-02-09 | Discharge: 2008-02-09 | Payer: Self-pay | Admitting: Emergency Medicine

## 2008-02-09 ENCOUNTER — Ambulatory Visit: Payer: Self-pay | Admitting: Surgery

## 2008-09-22 ENCOUNTER — Encounter (INDEPENDENT_AMBULATORY_CARE_PROVIDER_SITE_OTHER): Payer: Self-pay | Admitting: Family Medicine

## 2008-09-22 ENCOUNTER — Telehealth: Payer: Self-pay | Admitting: *Deleted

## 2008-09-22 ENCOUNTER — Encounter (INDEPENDENT_AMBULATORY_CARE_PROVIDER_SITE_OTHER): Payer: Self-pay | Admitting: *Deleted

## 2008-09-22 ENCOUNTER — Ambulatory Visit: Payer: Self-pay | Admitting: Family Medicine

## 2008-09-22 DIAGNOSIS — N6459 Other signs and symptoms in breast: Secondary | ICD-10-CM

## 2008-09-22 DIAGNOSIS — N920 Excessive and frequent menstruation with regular cycle: Secondary | ICD-10-CM

## 2008-09-24 LAB — CONVERTED CEMR LAB
HCT: 37.7 % (ref 36.0–46.0)
MCV: 91.7 fL (ref 78.0–100.0)
Preg, Serum: NEGATIVE
Prolactin: 16.1 ng/mL
RBC: 4.11 M/uL (ref 3.87–5.11)
RDW: 14.3 % (ref 11.5–15.5)
TSH: 0.758 microintl units/mL (ref 0.350–4.50)

## 2008-09-29 ENCOUNTER — Encounter: Admission: RE | Admit: 2008-09-29 | Discharge: 2008-09-29 | Payer: Self-pay | Admitting: Family Medicine

## 2008-10-08 ENCOUNTER — Ambulatory Visit: Payer: Self-pay | Admitting: Family Medicine

## 2008-11-24 ENCOUNTER — Encounter: Payer: Self-pay | Admitting: Internal Medicine

## 2008-11-24 ENCOUNTER — Encounter: Payer: Self-pay | Admitting: Infectious Disease

## 2008-11-24 ENCOUNTER — Ambulatory Visit: Payer: Self-pay | Admitting: Internal Medicine

## 2008-11-24 LAB — CONVERTED CEMR LAB
ALT: 12 units/L (ref 0–35)
AST: 17 units/L (ref 0–37)
BUN: 10 mg/dL (ref 6–23)
Basophils Absolute: 0 10*3/uL (ref 0.0–0.1)
CO2: 21 meq/L (ref 19–32)
Chloride: 107 meq/L (ref 96–112)
Cholesterol: 190 mg/dL (ref 0–200)
Glucose, Bld: 82 mg/dL (ref 70–99)
HIV 1 RNA Quant: <48 copies/mL (ref <48)
LDL Cholesterol: 113 mg/dL — ABNORMAL HIGH (ref 0–99)
Monocytes Relative: 9 % (ref 3–12)
Neutrophils Relative %: 58 % (ref 43–77)
RBC: 4.26 M/uL (ref 3.87–5.11)
RDW: 14.5 % (ref 11.5–15.5)
VLDL: 15 mg/dL (ref 0–40)

## 2008-11-28 ENCOUNTER — Encounter: Payer: Self-pay | Admitting: Infectious Disease

## 2008-12-10 ENCOUNTER — Ambulatory Visit: Payer: Self-pay | Admitting: Internal Medicine

## 2008-12-10 DIAGNOSIS — J029 Acute pharyngitis, unspecified: Secondary | ICD-10-CM

## 2008-12-11 ENCOUNTER — Encounter (INDEPENDENT_AMBULATORY_CARE_PROVIDER_SITE_OTHER): Payer: Self-pay | Admitting: *Deleted

## 2009-01-28 IMAGING — CR DG KNEE COMPLETE 4+V*L*
4 series · 4 of 4 positions shown · non-contrast
Comparison: None

CLINICAL DATA: Cramping in left leg and foot.  Left medial pain

LEFT KNEE - COMPLETE 4+ VIEW

[t knee oblique left (1 of 2)]
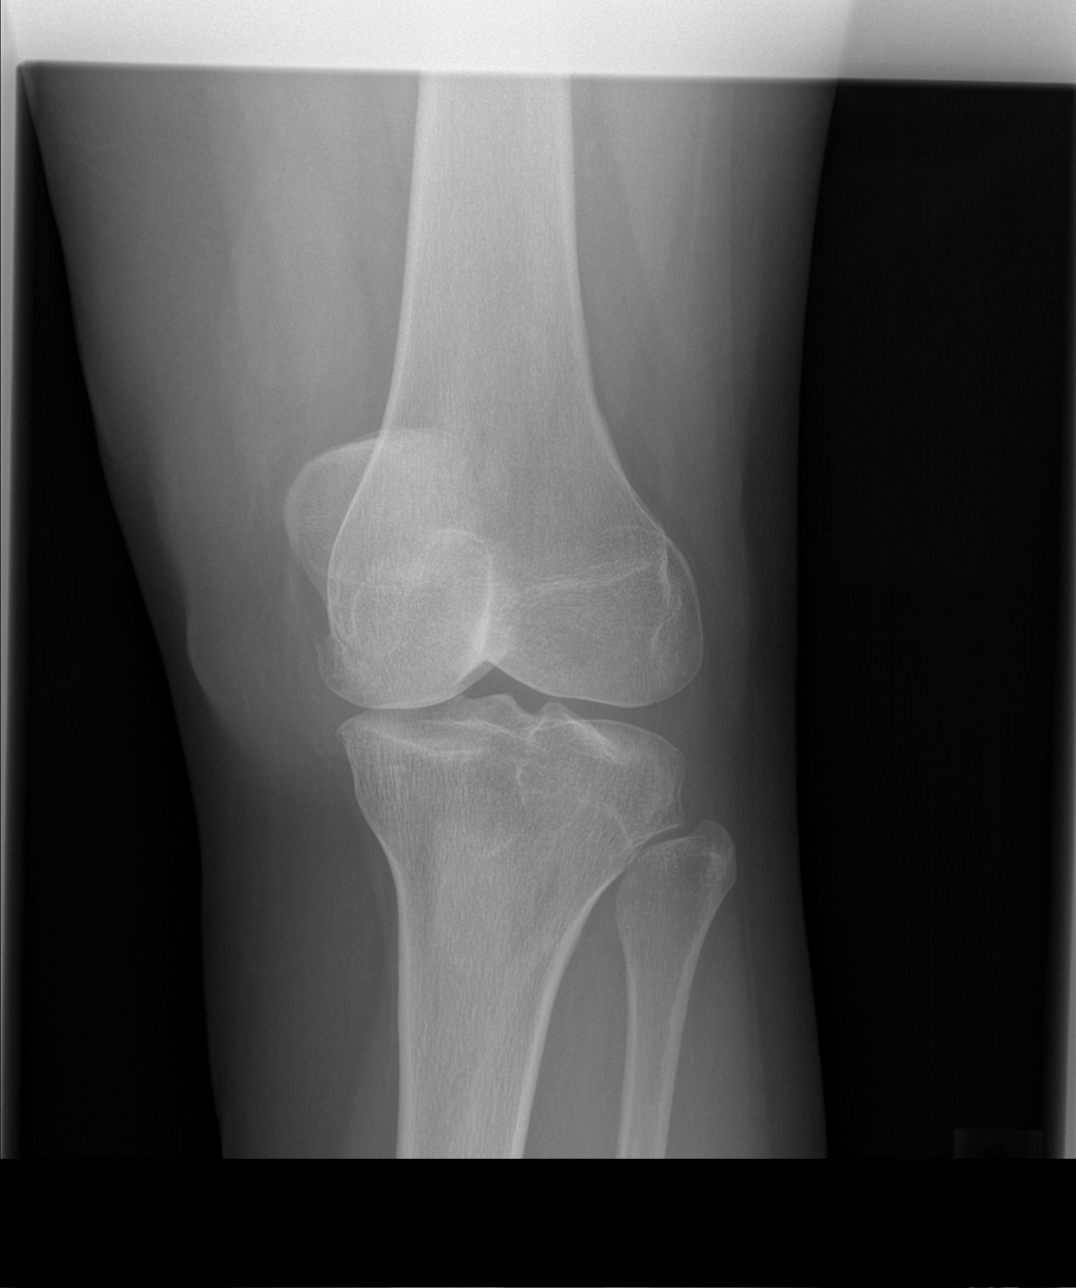

[t knee ap left]
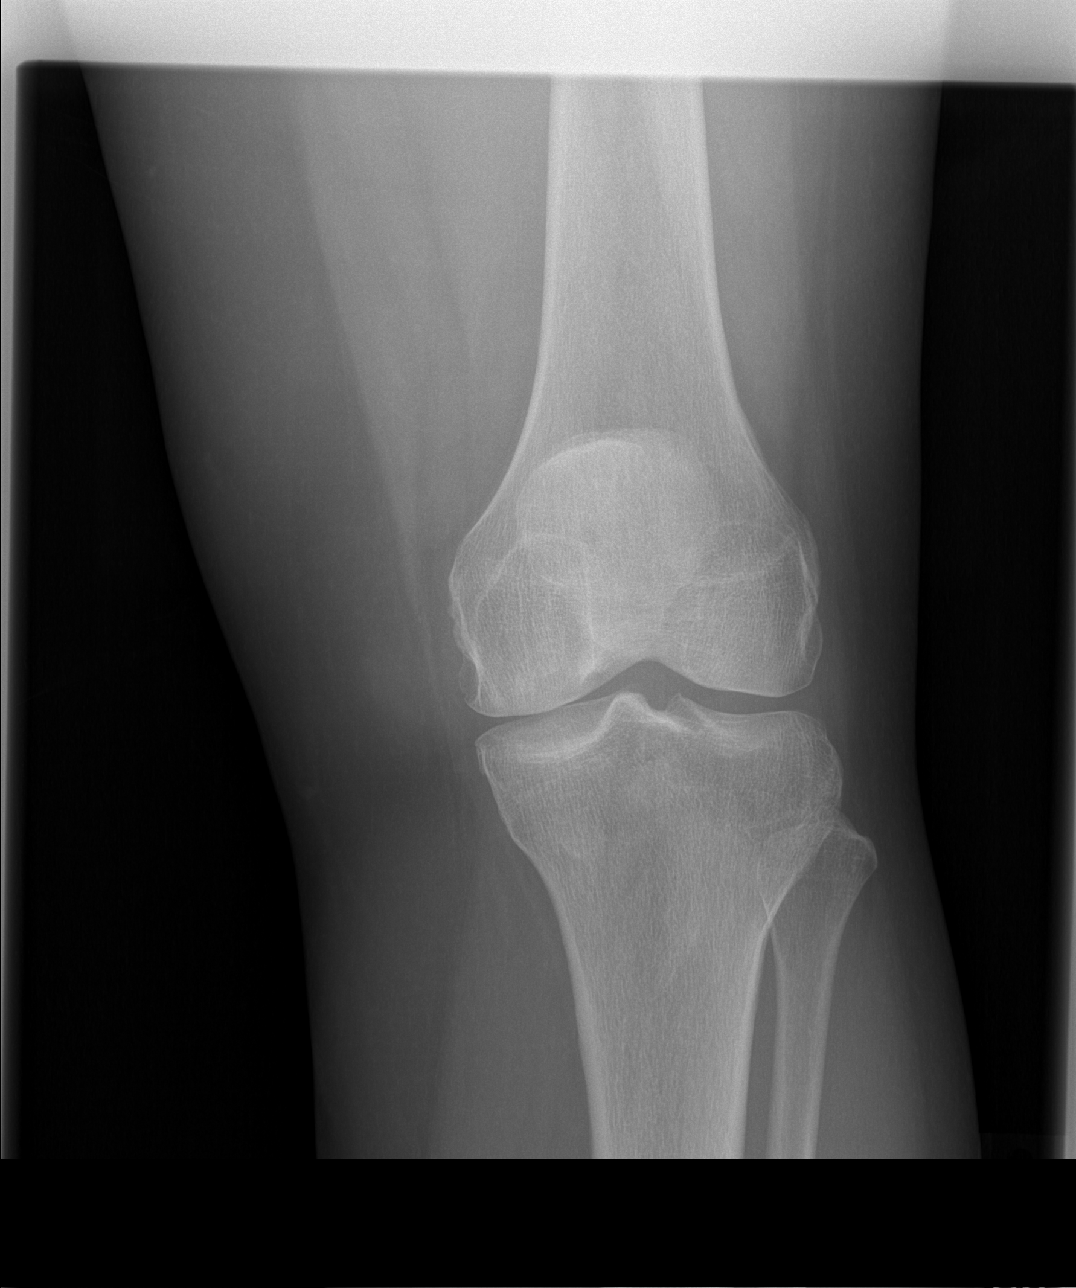

[t knee oblique left (2 of 2)]
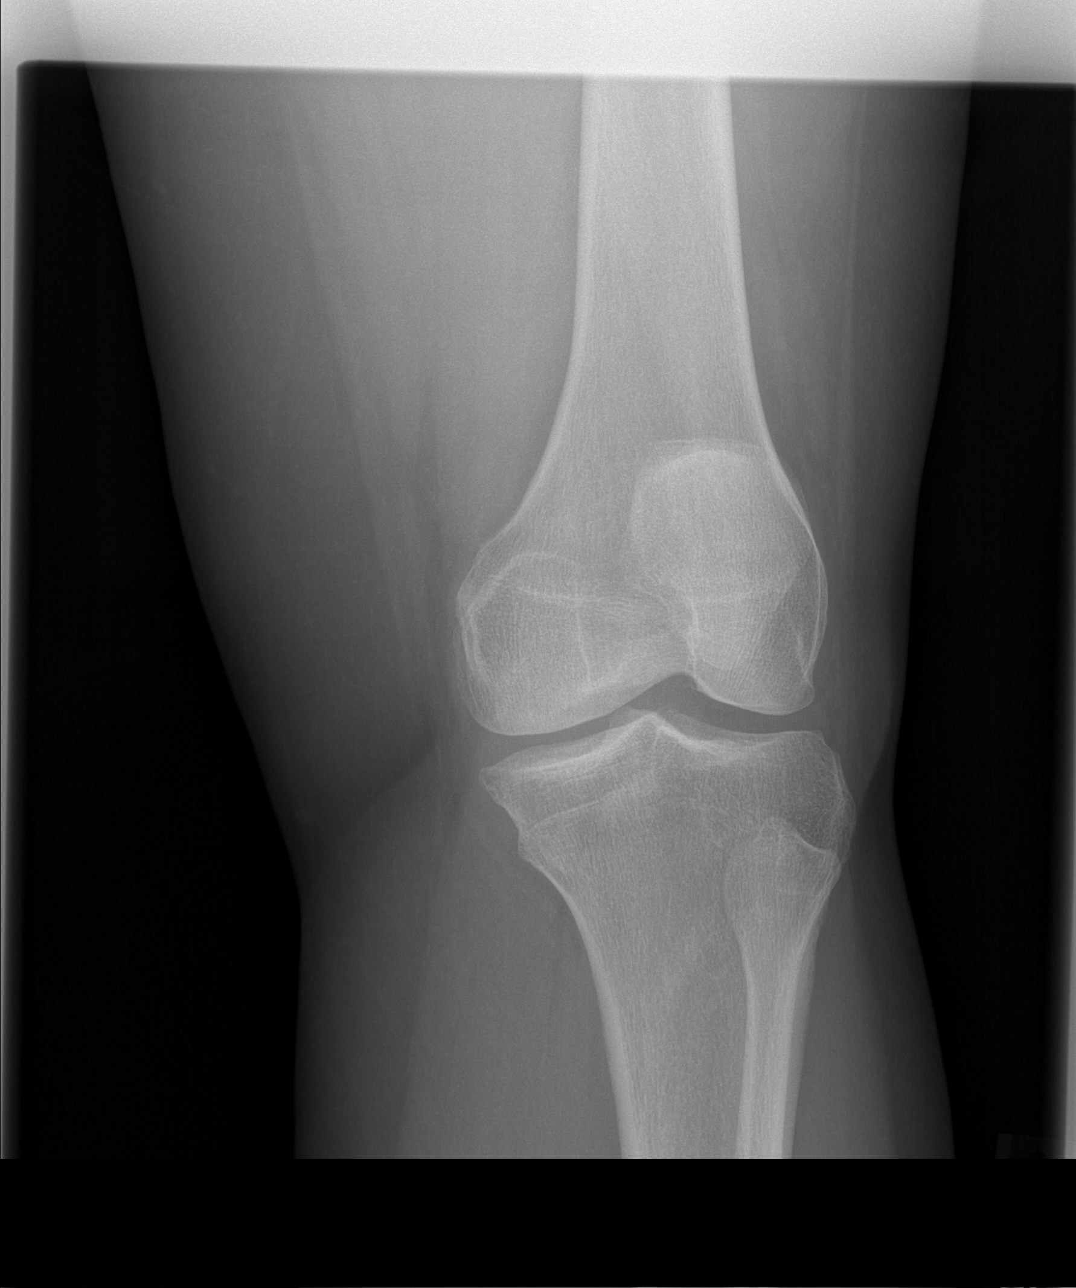

[t knee lat left]
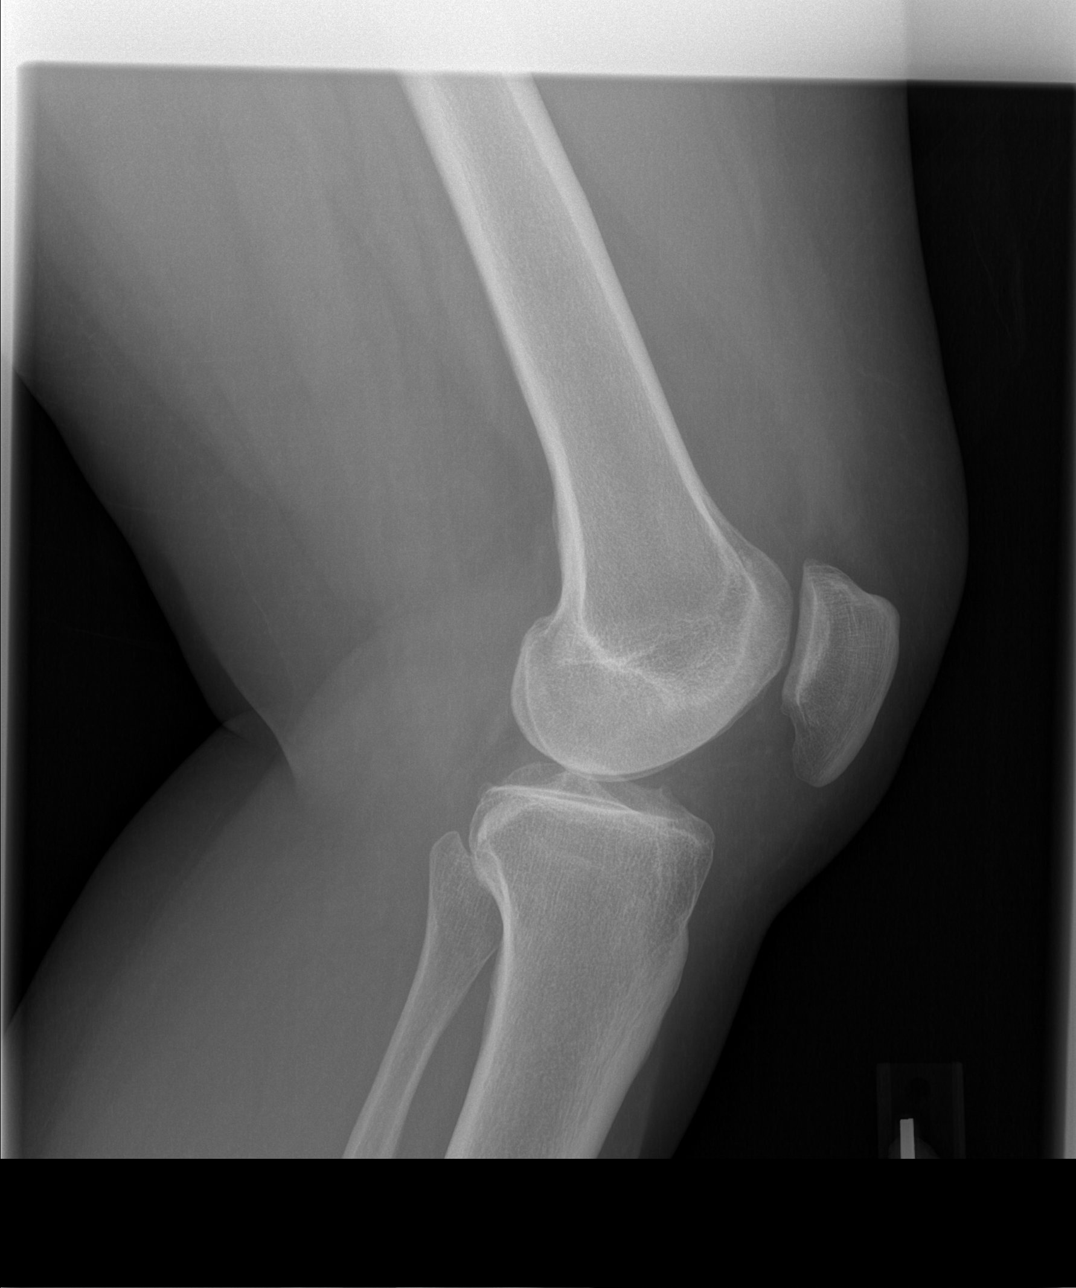

[4 of 4 positions shown; findings below may reference images not displayed]

FINDINGS: No evidence of fracture or dislocation.  No joint
effusion.
IMPRESSION: 1.  No fracture or dislocation.

## 2010-03-26 ENCOUNTER — Encounter: Payer: Self-pay | Admitting: Infectious Disease

## 2010-04-07 ENCOUNTER — Encounter (INDEPENDENT_AMBULATORY_CARE_PROVIDER_SITE_OTHER): Payer: Self-pay | Admitting: *Deleted

## 2010-05-18 ENCOUNTER — Encounter (INDEPENDENT_AMBULATORY_CARE_PROVIDER_SITE_OTHER): Payer: Self-pay | Admitting: *Deleted

## 2010-10-10 ENCOUNTER — Encounter: Payer: Self-pay | Admitting: Family Medicine

## 2010-10-21 NOTE — Letter (Signed)
Summary: [facility]  [address]   [place], [place] [address]   Phone: [phone]  Fax: [phone]          March 26, 2010  Jacqueline Stevenson [address] [place], [place]  [address]  Dear [name],  I was unable to call you by phone to make a follow-up appointment for you with Dr. Daiva Eves and for lab work.  It is also time for your annual PAP smear.  Please call the above number and follow the prompts to schedule these appointment.  Dr. Daiva Eves and I look forward to seeing you soon.  Please note the change of address for the office.  We are now located in the [facility], [address] across from the Emergency Department at [facility].  Thank you for calling to make these appointments.   Sincerely,    Jennet Maduro [facility]

## 2010-10-21 NOTE — Miscellaneous (Signed)
Summary: Per [facility], pt. has moved to [place]  Clinical Lists Changes  Observations: Added new observation of RW VITAL STA: Relocated (05/18/2010 15:57) Added new observation of HIV STATUS: HIV positive - not AIDS (05/18/2010 15:57)

## 2010-10-21 NOTE — Miscellaneous (Signed)
Summary: Orders Update - Referral for [facility]  Clinical Lists Changes  Orders: Added new Referral order of Misc. Referral (Misc. Ref) - Signed

## 2010-12-30 LAB — T-HELPER CELL (CD4) - (RCID CLINIC ONLY)
CD4 % Helper T Cell: 40 % (ref 33–55)
CD4 T Cell Abs: 650 uL (ref 400–2700)
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
## 2011-02-04 NOTE — Op Note (Signed)
NAME:  MACRINA, LEHNERT                     ACCOUNT NO.:  [id number]   MEDICAL RECORD NO.:  [id number]                   PATIENT TYPE:  INP   LOCATION:  9130                                 FACILITY:  [facility]   PHYSICIAN:  Lesly Dukes, M.D.              DATE OF BIRTH:  September 06, 1966   DATE OF PROCEDURE:  02/09/2004  DATE OF DISCHARGE:                                 OPERATIVE REPORT   PREOPERATIVE DIAGNOSES:  A 45 year old, para 3-0-3-3 desiring permanent  sterilization.   POSTOPERATIVE DIAGNOSES:  A 45 year old, para 3-0-3-3 desiring permanent  sterilization.   PROCEDURE:  Postpartum bilateral tubal ligation.   SURGEON:  Lesly Dukes, M.D.   ESTIMATED BLOOD LOSS:  30 mL.   COMPLICATIONS:  None.   DESCRIPTION OF PROCEDURE:  After informed consent was obtained, the patient  was taken to the operating room where general anesthesia was found to be  adequate. The patient in the dorsal supine position. An infraumbilical skin  incision was made with a scalpel and carried down to the __________ layer of  fascia, the fascia was elevated and incised sharply with the scalpel. This  incision was extended bilaterally with the Mayo scissors.  The peritoneum  was then identified, tinted up and entered sharply with the Metzenbaum  scissors.  This incision was extended bluntly. The [employer] retractors were  placed into the abdomen and the right fallopian tube was brought into view,  grasped with the Babcock clamp and brought out to its fimbriated end. The  right fallopian tube was then elevated in the middle third and a Filshie  clip was applied. It was felt that the clip was slightly askew so a second  Filshie clip was placed to try to include the right fallopian tube.  Good  hemostasis was noted on the right fallopian tube. The right fallopian tube  was returned to the abdomen. The left fallopian tube was then identified and  brought out to its fimbriated end. The middle third was then  elevated with  the Babcock clamp and one Filshie clip was applied. Good hemostasis was  noted.  The left fallopian tube was returned to the abdomen. Good hemostasis  was noted at the umbilicus. The fascia was closed with #0 Vicryl in a  running fashion. The skin was closed with Dermabond. The infraumbilical area  was then infiltrated with 0.25% Marcaine to aid in postop analgesics.                                               Lesly Dukes, M.D.    Lora Paula  D:  02/09/2004  T:  02/10/2004  Job:  [id number]

## 2011-03-13 MED ORDER — AMOXICILLIN 500 MG TABLET
500 mg | ORAL_TABLET | Freq: Three times a day (TID) | ORAL | Status: DC
Start: 2011-03-13 — End: 2011-06-30

## 2011-03-13 MED ORDER — IBUPROFEN 600 MG TAB
600 mg | ORAL_TABLET | Freq: Four times a day (QID) | ORAL | Status: DC | PRN
Start: 2011-03-13 — End: 2011-06-30

## 2011-03-13 MED ORDER — CETIRIZINE-PSEUDOEPHEDRINE SR 5 MG-120 MG 12 HR TAB
5-120 mg | ORAL_TABLET | Freq: Two times a day (BID) | ORAL | Status: DC
Start: 2011-03-13 — End: 2011-06-30

## 2011-03-13 NOTE — ED Notes (Signed)
Patient (s)  given copy of dc instructions and 3 script(s).  Patient (s)  verbalized understanding of instructions and script (s).  Patient given a current medication reconciliation form and verbalized understanding of their medications.   Patient (s) verbalized understanding of the importance of discussing medications with  his or her physician or clinic they will be following up with.  Patient alert and oriented and in no acute distress.  Patient discharged home ambulatory with self.

## 2011-03-13 NOTE — ED Provider Notes (Signed)
Patient is a 45 y.o. female presenting with sinus pain. The history is provided by the patient. No language interpreter was used.   Sinus Pain   This is a new problem. The current episode started yesterday. The problem has not changed since onset.There has been no fever. Associated symptoms include congestion, sinus pressure, sore throat and cough. She has tried nothing for the symptoms.          No past medical history on file.     No past surgical history on file.      No family history on file.     History     Social History   ??? Marital Status: Single     Spouse Name: N/A     Number of Children: N/A   ??? Years of Education: N/A     Occupational History   ??? Not on file.     Social History Main Topics   ??? Smoking status: Current Some Day Smoker   ??? Smokeless tobacco: Current User   ??? Alcohol Use: Yes   ??? Drug Use: No   ??? Sexually Active:      Other Topics Concern   ??? Not on file     Social History Narrative   ??? No narrative on file                  ALLERGIES: Review of patient's allergies indicates no known allergies.      Review of Systems   HENT: Positive for congestion, sore throat and sinus pressure.    Respiratory: Positive for cough.    [all other systems reviewed and are negative        Filed Vitals:    03/13/11 1334   BP: 134/78   Pulse: 66   Temp: 98 ??F (36.7 ??C)   Resp: 18   Height: 5\' 10"  (1.778 m)   Weight: 86.183 kg (190 lb)   SpO2: 100%            Physical Exam   [nursing notereviewed.  Constitutional: She is oriented to person, place, and time. She appears well-developed and well-nourished.        Pleasant but with flat affect   HENT:   Head: Normocephalic and atraumatic.   Right Ear: External ear normal.   Left Ear: External ear normal.   Mouth/Throat: No oropharyngeal exudate.        Erythematous pharynx without edema or exudate, nasal congestion    Eyes: Conjunctivae and EOM are normal. Pupils are equal, round, and reactive to light. Right eye exhibits no discharge. Left eye exhibits no discharge. No scleral icterus.   Neck: Normal range of motion. Neck supple. No JVD present. No tracheal deviation present. Thyromegaly present.   Cardiovascular: Normal rate, regular rhythm and normal heart sounds.    No murmur heard.  Pulmonary/Chest: Effort normal and breath sounds normal. Stridor present. No respiratory distress. She has no wheezes. She has no rales. She exhibits no tenderness.   Abdominal: Soft. She exhibits no distension. There is no tenderness. There is no rebound and no guarding.   Musculoskeletal: Normal range of motion. She exhibits no edema and no tenderness.   Lymphadenopathy:     She has no cervical adenopathy.   Neurological: She is alert and oriented to person, place, and time. No cranial nerve deficit. Coordination normal.   Skin: Skin is warm. No rash noted. No erythema. No pallor.   Psychiatric: She has a normal mood and affect. Her  behavior is normal. Judgment and thought content normal.        MDM    Procedures

## 2011-06-13 LAB — T-HELPER CELL (CD4) - (RCID CLINIC ONLY): CD4 T Cell Abs: 790

## 2011-06-15 LAB — POCT I-STAT, CHEM 8
Calcium, Ion: 1.23
Chloride: 102
Creatinine, Ser: 1.3 — ABNORMAL HIGH
Glucose, Bld: 79
Potassium: 4.2

## 2011-06-30 NOTE — ED Notes (Signed)
Bedside shift change report given to Jessie RN (oncoming nurse) by Diane RN (offgoing nurse).  Report given with SBAR, ED Summary, MAR and Recent Results.
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
## 2011-06-30 NOTE — ED Notes (Signed)
Bedside report given to Diane O, RN.

## 2011-06-30 NOTE — ED Provider Notes (Signed)
HPI Comments: Traci [name] is a 45 y.o. female who presents to the ED via EMS with CC of a syncopal episode at approximately 9:20 PM today while at work in which she hit her chin on a counter top. The pt states that she had not eaten all day today due to loss of appetite and began feeling hot, lightheaded, dizzy, and diaphoretic at work so she stood in the freezer for a minute. Upon walking out she collapsed and lost consciousness, hitting her chin on a counter. The pt now complains of a 8/10 L sided HA, 8/10 L neck pain, a chipped tooth, and a laceration under her chin. The pt notes she forgot to take her HTN meds at [facility] today. The pt states she has intermittent constipation and diarrhea at baseline. The patient specifically denies chills, cold sxs, cough, SOB, CP, palpitations, leg swelling, ABD pain, nausea, vomiting, bloody stool, vaginal bleeding, vaginal discharge, urinary sxs, rash, weakness, and numbness. The pt states she was recently admitted at [facility] because she did not eat for 1 week and was discharged on September 26th.    PCP: Dr. Anne Hahn @ [facility], Appointment on Friday  Allergies: none  Significant PMHx: HTN, GERD, HIV  Family MHx: DM  Significant PSHx: Tubal ligation, L knee  Social Hx: + chew tobacco, + alcohol, - illicit drugs    There are no other complaints, changes or physical findings at this time.   Written by Morley Kos, ED Scribe, as dictated by Neldon Newport, MD    The history is provided by the patient and the EMS personnel.        Past Medical History   Diagnosis Date   ??? Hypertension    ??? Gastrointestinal disorder      GERD   ??? Other ill-defined conditions      HIV        Past Surgical History   Procedure Date   ??? Hx gyn      Tubal ligation   ??? Hx orthopaedic      Left knee         No family history on file.     History     Social History   ??? Marital Status: Single     Spouse Name: N/A     Number of Children: N/A   ??? Years of Education: N/A     Occupational History    ??? Not on file.     Social History Main Topics   ??? Smoking status: Current Some Day Smoker   ??? Smokeless tobacco: Current User   ??? Alcohol Use: Yes   ??? Drug Use: No   ??? Sexually Active:      Other Topics Concern   ??? Not on file     Social History Narrative   ??? No narrative on file                  ALLERGIES: Review of patient's allergies indicates no known allergies.      Review of Systems   Constitutional: Positive for diaphoresis and appetite change. Negative for chills. Fever: felt hot.   HENT: Positive for neck pain (8/10 L sided). Negative for congestion, sore throat, rhinorrhea, sneezing, postnasal drip and sinus pressure.    Eyes: Negative.    Respiratory: Negative.  Negative for cough and shortness of breath.    Cardiovascular: Negative.  Negative for chest pain, palpitations and leg swelling.   Gastrointestinal: Negative for nausea, vomiting, abdominal  pain and blood in stool. Diarrhea: see HPI. Constipation: see HPI.   Genitourinary: Negative.  Negative for dysuria, urgency, frequency, hematuria, decreased urine volume and difficulty urinating.   Musculoskeletal: Negative for back pain.   Skin: Positive for wound (laceration under chin). Negative for rash.   Neurological: Positive for dizziness, syncope, light-headedness and headaches (8/10 L sided). Negative for weakness and numbness.   Hematological: Negative.    Psychiatric/Behavioral: Negative.    All other systems reviewed and are negative.    Written by Morley Kos, ED Scribe, as dictated by Neldon Newport, MD     Filed Vitals:    06/30/11 2154   BP: 140/62   Pulse: 60   Temp: 97.7 ??F (36.5 ??C)   Resp: 16   Height: 5\' 9"  (1.753 m)   Weight: 87.544 kg (193 lb)   SpO2: 100%            Physical Exam   Nursing note and vitals reviewed.  Constitutional: She is oriented to person, place, and time. She appears well-developed and well-nourished. She appears distressed.   HENT:   Head: Normocephalic.        Chin abrasion, chipped tooth #9    Eyes: EOM are normal. Pupils are equal, round, and reactive to light.   Neck:        Cervical tenderness   Cardiovascular: Normal rate, regular rhythm, normal heart sounds and intact distal pulses.  Exam reveals no friction rub.    No murmur heard.  Pulmonary/Chest: Effort normal and breath sounds normal. No respiratory distress. She has no wheezes. She has no rales. She exhibits no tenderness.   Abdominal: Soft. Bowel sounds are normal. She exhibits no distension. There is no tenderness. There is no rebound and no guarding.   Musculoskeletal: Normal range of motion. She exhibits no edema and no tenderness.   Neurological: She is alert and oriented to person, place, and time. She exhibits normal muscle tone. Coordination normal.   Skin: Skin is warm and dry. She is not diaphoretic. No pallor.   Psychiatric: She has a normal mood and affect. Her behavior is normal.        MDM     Differential Diagnosis; Clinical Impression; Plan:     Dehydration, electrolyte abnormality, anemia, UTI, CHI, subdural, strain  Amount and/or Complexity of Data Reviewed:   Clinical lab tests:  Ordered and reviewed  Tests in the radiology section of CPT??:  Ordered and reviewed  Tests in the medicine section of the CPT??:  Ordered and reviewed   Decide to obtain previous medical records or to obtain history from someone other than the patient:  Yes   Obtain history from someone other than the patient:  Yes   Review and summarize past medical records:  Yes   Discuss the patient with another provider:  Yes (EMS)   Independant visualization of image, tracing, or specimen:  Yes  Progress:   Patient progress:  Improved and stable      Procedures    Procedure Note - C-collar removed:   11:30 PM  Performed by: Neldon Newport, MD  C-spine cleared. C-collar removed.   Written by Morley Kos, ED Scribe, as dictated by Neldon Newport, MD.      PROGRESS NOTE  11:42 PM   The patient has been reevaluated and she is feeling better but she still has a mild HA.  Written by Morley Kos, ED Scribe, as dictated by Neldon Newport, MD  DISCHARGE NOTE  12:56 AM  Traci [name] has been reevaluated and she is feeling much better. She will go home with percocet and zithromax. Her  results have been reviewed with her.  She has been counseled regarding her diagnosis.  She verbally conveys understanding and agreement of the signs, symptoms, diagnosis, treatment and prognosis and additionally agrees to follow up as recommended with Dr. Anne Hahn today as scheduled.  She also agrees with the care-plan and conveys that all of her questions have been answered.  I have also put together some discharge instructions for her that include: 1) educational information regarding their diagnosis, 2) how to care for their diagnosis at home, as well a 3) list of reasons why they would want to return to the ED prior to their follow-up appointment, should their condition change.    Written by Morley Kos, ED Scribe, as dictated by Neldon Newport, MD    ED EKG interpretation:  Rhythm: sinus bradycardia; and regular . Rate (approx.): 59; Axis: normal; P wave: normal; QRS interval: normal ; ST/T wave: T wave inverted; in  Lead: III ; Other findings: . This EKG was interpreted by Neldon Newport, MD,ED Provider.

## 2011-07-01 LAB — PROTHROMBIN TIME + INR
INR: 1 (ref 0.9–1.1)
Prothrombin time: 10.8 s (ref 9.4–11.7)

## 2011-07-01 LAB — METABOLIC PANEL, COMPREHENSIVE
A-G Ratio: 0.9 — ABNORMAL LOW (ref 1.1–2.2)
ALT (SGPT): 36 U/L (ref 12–78)
AST (SGOT): 17 U/L (ref 15–37)
Albumin: 3.6 g/dL (ref 3.5–5.0)
Alk. phosphatase: 68 U/L (ref 50–136)
Anion gap: 9 mmol/L (ref 5–15)
BUN/Creatinine ratio: 10 — ABNORMAL LOW (ref 12–20)
BUN: 12 MG/DL (ref 6–20)
Bilirubin, total: 0.2 MG/DL (ref 0.2–1.0)
CO2: 27 MMOL/L (ref 21–32)
Calcium: 8.6 MG/DL (ref 8.5–10.1)
Chloride: 101 MMOL/L (ref 97–108)
Creatinine: 1.2 MG/DL (ref 0.6–1.3)
GFR est AA: >60 mL/min/{1.73_m2} (ref >60)
GFR est non-AA: 52 mL/min/{1.73_m2} — ABNORMAL LOW (ref >60)
Globulin: 4.1 g/dL — ABNORMAL HIGH (ref 2.0–4.0)
Glucose: 105 MG/DL — ABNORMAL HIGH (ref 65–100)
Potassium: 3.6 MMOL/L (ref 3.5–5.1)
Protein, total: 7.7 g/dL (ref 6.4–8.2)
Sodium: 137 MMOL/L (ref 136–145)

## 2011-07-01 LAB — EKG, 12 LEAD, INITIAL
Atrial Rate: 59 {beats}/min
Calculated P Axis: -2 degrees
Calculated R Axis: 52 degrees
Calculated T Axis: -9 degrees
Diagnosis: NORMAL
P-R Interval: 178 ms
Q-T Interval: 390 ms
QRS Duration: 74 ms
QTC Calculation (Bezet): 386 ms
Ventricular Rate: 59 {beats}/min

## 2011-07-01 LAB — CK W/ CKMB & INDEX
CK - MB: 0.8 NG/ML (ref 0.5–3.6)
CK-MB Index: 0.5 (ref 0–2.5)
CK: 152 U/L (ref 26–192)

## 2011-07-01 LAB — URINALYSIS W/ REFLEX CULTURE
Bacteria: NEGATIVE /HPF
Bilirubin: NEGATIVE
Blood: NEGATIVE
Glucose: NEGATIVE MG/DL
Ketone: NEGATIVE MG/DL
Leukocyte Esterase: NEGATIVE
Nitrites: NEGATIVE
Protein: NEGATIVE MG/DL
Specific gravity: 1.015 (ref 1.003–1.030)
Urobilinogen: 0.2 EU/DL (ref 0.2–1.0)
pH (UA): 5.5 (ref 5.0–8.0)

## 2011-07-01 LAB — CBC WITH AUTOMATED DIFF
ABS. BASOPHILS: 0 10*3/uL (ref 0.0–0.1)
ABS. EOSINOPHILS: 0.1 10*3/uL (ref 0.0–0.4)
ABS. LYMPHOCYTES: 1.6 10*3/uL (ref 0.8–3.5)
ABS. MONOCYTES: 0.5 10*3/uL (ref 0.0–1.0)
ABS. NEUTROPHILS: 2.9 10*3/uL (ref 1.8–8.0)
BASOPHILS: 0 % (ref 0–1)
EOSINOPHILS: 2 % (ref 0–7)
HCT: 34.2 % — ABNORMAL LOW (ref 35.0–47.0)
HGB: 11.3 g/dL — ABNORMAL LOW (ref 11.5–16.0)
LYMPHOCYTES: 31 % (ref 12–49)
MCH: 27.7 PG (ref 26.0–34.0)
MCHC: 33 g/dL (ref 30.0–36.5)
MCV: 83.8 FL (ref 80.0–99.0)
MONOCYTES: 9 % (ref 5–13)
NEUTROPHILS: 58 % (ref 32–75)
PLATELET: 413 10*3/uL — ABNORMAL HIGH (ref 150–400)
RBC: 4.08 M/uL (ref 3.80–5.20)
RDW: 16 % — ABNORMAL HIGH (ref 11.5–14.5)
WBC: 5 10*3/uL (ref 3.6–11.0)

## 2011-07-01 LAB — MAGNESIUM: Magnesium: 2.1 MG/DL (ref 1.6–2.4)

## 2011-07-01 LAB — PTT: aPTT: 24.3 s (ref 24.0–31.5)

## 2011-07-01 LAB — TROPONIN I: Troponin-I, Qt.: <0.04 ng/mL (ref <0.05)

## 2011-07-01 MED ORDER — AZITHROMYCIN 250 MG TAB
250 mg | PACK | ORAL | Status: DC
Start: 2011-07-01 — End: 2012-05-19

## 2011-07-01 MED ORDER — ONDANSETRON (PF) 4 MG/2 ML INJECTION
4 mg/2 mL | INTRAMUSCULAR | Status: AC
Start: 2011-07-01 — End: 2011-06-30
  Administered 2011-07-01: 03:00:00 via INTRAVENOUS

## 2011-07-01 MED ORDER — KETOROLAC TROMETHAMINE 30 MG/ML INJECTION
30 mg/mL (1 mL) | INTRAMUSCULAR | Status: AC
Start: 2011-07-01 — End: 2011-06-30
  Administered 2011-07-01: 04:00:00 via INTRAVENOUS

## 2011-07-01 MED ORDER — OXYCODONE-ACETAMINOPHEN 5 MG-325 MG TAB
5-325 mg | ORAL_TABLET | ORAL | Status: DC | PRN
Start: 2011-07-01 — End: 2012-05-19

## 2011-07-01 MED ADMIN — morphine injection 4 mg: INTRAVENOUS | @ 03:00:00 | NDC 00641607001

## 2011-07-01 MED ADMIN — tetanus-diphtheria toxids-td 5-2 Lf unit/0.5 mL injection 0.5 mL: INTRAMUSCULAR | @ 05:00:00 | NDC 49281029110

## 2011-07-01 MED ADMIN — sodium chloride 0.9 % bolus infusion 1,000 mL: INTRAVENOUS | @ 03:00:00 | NDC 00409798309
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
## 2012-05-19 NOTE — ED Notes (Signed)
Patient given copy of dc instructions and 2 scripts. Patient verbalized understanding of instructions & script(s). Patient given current medication form and verbalized understanding of their medications. Patient verbalized the importance of discussing medications with her physician or clinic they will follow up with. Patient alert and oriented and in no acute distress. Patient discharged home with self.
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
## 2013-04-16 ENCOUNTER — Inpatient Hospital Stay
Admit: 2013-04-16 | Discharge: 2013-04-19 | Disposition: A | Discharge status: Home / Self Care | Payer: MEDICAID | Attending: Psychiatry | Admitting: Psychiatry

## 2013-04-16 DIAGNOSIS — F39 Unspecified mood [affective] disorder: Secondary | ICD-10-CM

## 2013-04-16 LAB — DRUG SCREEN, URINE
AMPHETAMINES: NEGATIVE
BARBITURATES: NEGATIVE
BENZODIAZEPINES: NEGATIVE
COCAINE: NEGATIVE
METHADONE: NEGATIVE
OPIATES: NEGATIVE
PCP(PHENCYCLIDINE): NEGATIVE
THC (TH-CANNABINOL): POSITIVE — AB

## 2013-04-16 LAB — ETHYL ALCOHOL: ALCOHOL(ETHYL),SERUM: NOT DETECTED MG/DL (ref <10)

## 2013-04-16 LAB — HCG URINE, QL. - POC: Pregnancy test,urine (POC): NEGATIVE

## 2013-04-16 LAB — POC CHEM8
Anion gap (POC): 17 mmol/L — ABNORMAL HIGH (ref 5–15)
BUN (POC): <3 MG/DL — ABNORMAL LOW (ref 9–20)
CO2 (POC): 23 MMOL/L (ref 21–32)
Calcium, ionized (POC): 1.1 MMOL/L — ABNORMAL LOW (ref 1.12–1.32)
Chloride (POC): 105 MMOL/L (ref 98–107)
Creatinine (POC): 0.8 MG/DL (ref 0.6–1.3)
GFRAA, POC: >60 mL/min/{1.73_m2} (ref >60)
GFRNA, POC: >60 mL/min/{1.73_m2} (ref >60)
Glucose (POC): 120 MG/DL — ABNORMAL HIGH (ref 65–105)
Hematocrit (POC): 42 % (ref 35.0–47.0)
Hemoglobin (POC): 14.3 GM/DL (ref 11.5–16.0)
Potassium (POC): 3.7 MMOL/L (ref 3.5–5.1)
Sodium (POC): 141 MMOL/L (ref 136–145)

## 2013-04-16 MED ADMIN — LORazepam (ATIVAN) tablet 1 mg: ORAL | @ 19:00:00 | NDC 68084041311

## 2013-04-16 NOTE — ED Provider Notes (Addendum)
HPI Comments: Patient states she is mad she says she gets upset with her fiancee and then wants to hurt herself. Patient is crying standing in room in the corner. She says she  as a pen which she holds when she is upset.    Patient is a 47 y.o. female presenting with anxiety and suicidal ideation. The history is provided by the patient. No language interpreter was used.   Anxiety   This is a recurrent problem. The current episode started more than 2 days ago. The problem has not changed since onset.The problem occurs daily. The pain is associated with stress. The pain is at a severity of 6/10. The pain is mild. The pain does not radiate. Pertinent negatives include no abdominal pain, no dizziness, no fever, no headaches, no palpitations, no shortness of breath, no vomiting and no weakness. She has tried nothing for the symptoms.   Suicidal  This is a recurrent problem. The current episode started more than 2 days ago. Pertinent negatives include no shortness of breath, no chest pain, no vomiting and no headaches.        Past Medical History   Diagnosis Date   ??? Hypertension    ??? Gastrointestinal disorder      GERD   ??? Other ill-defined conditions      HIV        Past Surgical History   Procedure Laterality Date   ??? Hx gyn       Tubal ligation   ??? Hx orthopaedic       Left knee         History reviewed. No pertinent family history.     History     Social History   ??? Marital Status: SINGLE     Spouse Name: N/A     Number of Children: N/A   ??? Years of Education: N/A     Occupational History   ??? Not on file.     Social History Main Topics   ??? Smoking status: Current Some Day Smoker   ??? Smokeless tobacco: Current User   ??? Alcohol Use: Yes   ??? Drug Use: Yes     Special: Marijuana   ??? Sexually Active: Not on file     Other Topics Concern   ??? Not on file     Social History Narrative   ??? No narrative on file                  ALLERGIES: Review of patient's allergies indicates no known allergies.      Review of Systems    Constitutional: Negative for fever and fatigue.   HENT: Negative for neck pain and neck stiffness.    Respiratory: Negative for shortness of breath and wheezing.    Cardiovascular: Negative for chest pain and palpitations.   Gastrointestinal: Negative for vomiting and abdominal pain.   Musculoskeletal: Negative for myalgias and arthralgias.   Skin: Negative for pallor and rash.   Neurological: Negative for dizziness, tremors, weakness and headaches.   Hematological: Negative for adenopathy.   Psychiatric/Behavioral:        Anxious   All other systems reviewed and are negative.        Filed Vitals:    04/16/13 1134   BP: 207/121   Pulse: 79   Temp: 98.6 ??F (37 ??C)   Resp: 18   Height: 5\' 7"  (1.702 m)   Weight: 77.565 kg (171 lb)   SpO2: 98%  Physical Exam   Nursing note and vitals reviewed.  Constitutional: She is oriented to person, place, and time. She appears well-developed and well-nourished. No distress.   HENT:   Head: Normocephalic and atraumatic.   Nose: Nose normal.   Mouth/Throat: Oropharynx is clear and moist. No oropharyngeal exudate.   Eyes: Conjunctivae and EOM are normal. Right eye exhibits no discharge. Left eye exhibits no discharge.   Neck: Normal range of motion. Neck supple. No tracheal deviation present.   Cardiovascular: Normal rate, regular rhythm and normal heart sounds.    No murmur heard.  Pulmonary/Chest: Effort normal and breath sounds normal. No respiratory distress. She has no wheezes. She has no rales. She exhibits no tenderness.   Abdominal: Soft. Bowel sounds are normal. She exhibits no distension. There is no tenderness. There is no rebound.   Musculoskeletal: Normal range of motion. She exhibits no edema and no tenderness.   Lymphadenopathy:     She has no cervical adenopathy.   Neurological: She is alert and oriented to person, place, and time. No cranial nerve deficit.   Skin: Skin is warm and dry. No rash noted. No erythema.   Psychiatric:   Pressured speech standing  in room crying frightened facial appearance. No eye contact        MDM     Differential Diagnosis; Clinical Impression; Plan:     DDX schizophrenia psychosis anxiety disorder  Amount and/or Complexity of Data Reviewed:   Clinical lab tests:  Ordered and reviewed   Obtain history from someone other than the patient:  Yes   Discuss the patient with another provider:  Yes      Procedures
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
## 2013-04-16 NOTE — ED Notes (Signed)
Pt states she did not take her blood pressure meds this AM.
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
## 2013-04-16 NOTE — ED Notes (Signed)
TRANSFER - OUT REPORT:    Verbal report given to Amanda,RN on Emojean Gertz  being transferred to Acute Behavioral Health Unit for routine progression of care       Report consisted of patient???s Situation, Background, Assessment and   Recommendations(SBAR).     Information from the following report(s) ED Summary, MAR and Recent Results was reviewed with the receiving nurse.    Opportunity for questions and clarification was provided.
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
## 2013-04-16 NOTE — ED Notes (Signed)
Rceived report from [place] M.,RN for transfer of care.

## 2013-04-16 NOTE — Behavioral Health Treatment Team (Cosign Needed)
GROUP THERAPY PROGRESS NOTE    The patient Traci [name] is participating in Reflection Group.    Group time: 30 minutes    Personal goal for participation: Personal    Goal orientation: Reflection    Group therapy participation: Active    Therapeutic interventions reviewed and discussed: Yes    ETHEL BARNETT-JOHNSON  04/16/2013

## 2013-04-16 NOTE — H&P (Signed)
History and Physical    Subjective:     Traci [name] is a 47 y.o. female with past medical hx as listed below. Pt is admitted to the hospital for decompensated psychosis. She was not fully aware of all her medical issues. Hx has been obtained from record. She does not appear to have any acute serious medical issues.     Past Medical History   Diagnosis Date   ??? Hypertension    ??? Gastrointestinal disorder      GERD   ??? Other ill-defined conditions      HIV   ??? Depression    ??? Psychotic disorder    ??? Substance abuse    ??? Anxiety disorder    ??? GERD (gastroesophageal reflux disease)    ??? Autoimmune disease      HIV      Past Surgical History   Procedure Laterality Date   ??? Hx gyn       Tubal ligation   ??? Hx orthopaedic       Left knee     History reviewed. No pertinent family history.   History   Substance Use Topics   ??? Smoking status: Current Some Day Smoker   ??? Smokeless tobacco: Current User   ??? Alcohol Use: Yes       Prior to Admission medications    Medication Sig Start Date End Date Taking? Authorizing Provider   lisinopril (PRINIVIL, ZESTRIL) 20 mg tablet Take 20 mg by mouth daily.      Phys Other, MD   hydrochlorothiazide (HYDRODIURIL) 25 mg tablet Take 25 mg by mouth daily.      Phys Other, MD     No Known Allergies     Review of Systems:  Constitutional: negative  Eyes: negative  Ears, Nose, Mouth, Throat, and Face: negative  Respiratory: negative  Cardiovascular: negative  Gastrointestinal: negative  Genitourinary:negative  Integument/Breast: negative  Hematologic/Lymphatic: negative  Musculoskeletal:negative  Neurological: negative  Behavioral/Psychiatric: psychosis  Endocrine: negative  Allergic/Immunologic: negative     Objective:     Intake and Output:            Physical Exam:   BP 149/103   Pulse 59   Temp(Src) 98.7 ??F (37.1 ??C)   Resp 18   Ht 5\' 7"  (1.702 m)   Wt 77.565 kg (171 lb)   BMI 26.78 kg/m2   SpO2 98%   Breastfeeding? No  General:  Alert, cooperative, no distress, appears stated age.    Head:  Normocephalic, without obvious abnormality, atraumatic.   Eyes:  Conjunctivae/corneas clear. PERRL, EOMs intact.   Ears:  Normal external ear canals both ears.   Nose: Nares normal. Septum midline. Mucosa normal. No drainage or sinus tenderness.   Throat: Lips, mucosa, and tongue normal. Teeth and gums normal.   Neck: Supple, symmetrical, trachea midline, no adenopathy, thyroid: no enlargement/tenderness/nodules, no carotid bruit and no JVD.   Back:   Symmetric, no curvature. ROM normal. No CVA tenderness.   Lungs:   Clear to auscultation bilaterally.   Chest wall:  No tenderness or deformity.   Heart:  Regular rate and rhythm, S1, S2 normal, no murmur, click, rub or gallop.   Abdomen:   Soft, non-tender. Bowel sounds normal. No masses,  No organomegaly.   Extremities: Extremities normal, atraumatic, no cyanosis or edema.   Pulses: 2+ and symmetric all extremities.   Skin: Skin color, texture, turgor normal. No rashes or lesions   Lymph nodes: Cervical, supraclavicular, and axillary  nodes normal.   Neurologic: CNII-XII intact. Normal strength, sensation and reflexes throughout.         Data Review:   Recent Results (from the past 24 hour(s))   DRUG SCREEN, URINE    Collection Time     04/16/13 12:00 PM       Result Value Range    AMPHETAMINE NEGATIVE   NEG      BARBITURATES NEGATIVE   NEG      BENZODIAZEPINE NEGATIVE   NEG      COCAINE NEGATIVE   NEG      METHADONE NEGATIVE   NEG      OPIATES NEGATIVE   NEG      PCP(PHENCYCLIDINE) NEGATIVE   NEG      THC (TH-CANNABINOL) POSITIVE (*) NEG      DRUG SCRN COMMENT (NOTE)     ETHYL ALCOHOL    Collection Time     04/16/13 12:40 PM       Result Value Range    ALCOHOL(ETHYL),SERUM NONE DETECTED  <10 MG/DL   POC CHEM8    Collection Time     04/16/13 12:42 PM       Result Value Range    Calcium, ionized (POC) 1.10 (*) 1.12 - 1.32 MMOL/L    Sodium (POC) 141  136 - 145 MMOL/L    Potassium (POC) 3.7  3.5 - 5.1 MMOL/L    Chloride (POC) 105  98 - 107 MMOL/L    CO2 (POC) 23   21 - 32 MMOL/L    Anion gap (POC) 17 (*) 5 - 15 mmol/L    Glucose (POC) 120 (*) 65 - 105 MG/DL    BUN (POC) <3 (*) 9 - 20 MG/DL    Creatinine (POC) 0.8  0.6 - 1.3 MG/DL    GFR-AA (POC) >16  >10 ml/min/1.85m2    GFR, non-AA (POC) >60  >60 ml/min/1.40m2    Hemoglobin (POC) 14.3  11.5 - 16.0 GM/DL    Hematocrit (POC) 42  35.0 - 47.0 %    Comment Comment Not Indicated.     HCG URINE, QL. - POC    Collection Time     04/16/13 12:46 PM       Result Value Range    Pregnancy test,urine (POC) NEGATIVE   NEG           Assessment:     Active Problems:    Psychosis (04/16/2013)      Depression (04/16/2013)    HTN    GERD    HIV    Substance abuse    Plan:     Continue antihypertensives    Check HIV1 viral load and CD4 count    Add Protonix    No VTE prophylaxis indicated or necessary at this time.   Signed By: Chelsea Primus, MD     April 16, 2013

## 2013-04-16 NOTE — Behavioral Health Treatment Team (Cosign Needed)
The patient Traci [name] is participating in Relaxation Group.    Group time: 30 minutes    Personal goal for participation: Personal    Goal orientation: To learn to relax    Group therapy participation: Active    Therapeutic interventions reviewed and discussed: Yes    ETHEL BARNETT-JOHNSON  04/16/2013

## 2013-04-17 LAB — METABOLIC PANEL, COMPREHENSIVE
A-G Ratio: 0.8 — ABNORMAL LOW (ref 1.1–2.2)
ALT (SGPT): 20 U/L (ref 12–78)
AST (SGOT): 15 U/L (ref 15–37)
Albumin: 3.1 g/dL — ABNORMAL LOW (ref 3.5–5.0)
Alk. phosphatase: 39 U/L — ABNORMAL LOW (ref 45–117)
Anion gap: 8 mmol/L (ref 5–15)
BUN/Creatinine ratio: 11 — ABNORMAL LOW (ref 12–20)
BUN: 8 MG/DL (ref 6–20)
Bilirubin, total: 0.3 MG/DL (ref 0.2–1.0)
CO2: 27 mmol/L (ref 21–32)
Calcium: 8.5 MG/DL (ref 8.5–10.1)
Chloride: 105 mmol/L (ref 97–108)
Creatinine: 0.75 MG/DL (ref 0.45–1.15)
GFR est AA: >60 mL/min/{1.73_m2} (ref >60)
GFR est non-AA: >60 mL/min/{1.73_m2} (ref >60)
Globulin: 3.9 g/dL (ref 2.0–4.0)
Glucose: 86 mg/dL (ref 65–100)
Potassium: 3.8 mmol/L (ref 3.5–5.1)
Protein, total: 7 g/dL (ref 6.4–8.2)
Sodium: 140 mmol/L (ref 136–145)

## 2013-04-17 LAB — CBC W/O DIFF
HCT: 36.5 % (ref 35.0–47.0)
HGB: 12.2 g/dL (ref 11.5–16.0)
MCH: 30.6 PG (ref 26.0–34.0)
MCHC: 33.4 g/dL (ref 30.0–36.5)
MCV: 91.5 FL (ref 80.0–99.0)
PLATELET: 339 10*3/uL (ref 150–400)
RBC: 3.99 M/uL (ref 3.80–5.20)
RDW: 13.6 % (ref 11.5–14.5)
WBC: 4.3 10*3/uL (ref 3.6–11.0)

## 2013-04-17 LAB — TSH 3RD GENERATION: TSH: 0.42 u[IU]/mL (ref 0.36–3.74)

## 2013-04-17 LAB — GLUCOSE, FASTING: Glucose: 86 MG/DL (ref 65–100)

## 2013-04-17 LAB — LIPID PANEL
CHOL/HDL Ratio: 3 (ref 0–5.0)
Cholesterol, total: 178 MG/DL (ref <200)
HDL Cholesterol: 59 MG/DL
LDL, calculated: 105.8 MG/DL — ABNORMAL HIGH (ref 0–100)
Triglyceride: 66 MG/DL (ref <150)
VLDL, calculated: 13.2 MG/DL

## 2013-04-17 MED ADMIN — acetaminophen (TYLENOL) tablet 650 mg: ORAL | @ 17:00:00 | NDC 50580048790

## 2013-04-17 MED ADMIN — pantoprazole (PROTONIX) tablet 40 mg: ORAL | @ 12:00:00 | NDC 00093001298

## 2013-04-17 MED ADMIN — hydrochlorothiazide (HYDRODIURIL) tablet 25 mg: ORAL | @ 12:00:00 | NDC 68084008611

## 2013-04-17 MED ADMIN — zolpidem (AMBIEN) tablet 10 mg: ORAL | @ 01:00:00 | NDC 51079072401

## 2013-04-17 MED ADMIN — lisinopril (PRINIVIL, ZESTRIL) tablet 20 mg: ORAL | @ 12:00:00 | NDC 68084019811

## 2013-04-17 NOTE — Behavioral Health Treatment Team (Cosign Needed)
GROUP THERAPY PROGRESS NOTE    The patient Traci [name] a 47 y.o. female is participating in Coping Skills Group.     Group time: 45 minutes    Personal goal for participation: To participate in relaxation activity    Goal orientation:  relaxation    Group therapy participation: active    Therapeutic interventions reviewed and discussed: favorite ways to relax    Impression of participation:  The patient was attentive.    BEVERLY S BAKER  04/17/2013  5:30 PM

## 2013-04-17 NOTE — Behavioral Health Treatment Team (Signed)
Pt rested in bed with eyes closed for approximately 7 hours. No reports made by pt of having any difficulty sleeping while staff conducted rounds. Respirations even and unlabored. No acute distress noted. Was maintained on q15min checks. Will continue to monitor.

## 2013-04-17 NOTE — Behavioral Health Treatment Team (Cosign Needed)
GROUP THERAPY PROGRESS NOTE    The patient Traci [name] is participating in Reflection Group.    Group time: 30 minutes    Personal goal for participation: Personal    Goal orientation: Reflection    Group therapy participation: Active    Therapeutic interventions reviewed and discussed: Yes    ETHEL BARNETT-JOHNSON  04/17/2013
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
## 2013-04-17 NOTE — Behavioral Health Treatment Team (Signed)
Patient had additional labs drawn this am. She was cooperative with the process.

## 2013-04-17 NOTE — Progress Notes (Signed)
General Daily Progress Note    Admit Date: 04/16/2013    Subjective:     Asked to see pt for rhinorrhea and chest congestion. Cough is productive of yellow sputum.       Current Facility-Administered Medications   Medication Dose Route Frequency   ??? pantoprazole (PROTONIX) tablet 40 mg  40 mg Oral ACB   ??? lisinopril (PRINIVIL, ZESTRIL) tablet 20 mg  20 mg Oral DAILY   ??? hydrochlorothiazide (HYDRODIURIL) tablet 25 mg  25 mg Oral DAILY   ??? [EXPIRED] cloNIDine (CATAPRES) tablet 0.2 mg  0.2 mg Oral NOW   ??? ziprasidone (GEODON) 20 mg in sterile water (preservative free) injection  20 mg IntraMUSCular BID PRN   ??? OLANZapine (ZYPREXA) tablet 5 mg  5 mg Oral Q6H PRN   ??? benztropine (COGENTIN) tablet 2 mg  2 mg Oral BID PRN   ??? benztropine (COGENTIN) injection 2 mg  2 mg IntraMUSCular Q12H PRN   ??? LORazepam (ATIVAN) injection 2 mg  2 mg IntraMUSCular Q4H PRN   ??? LORazepam (ATIVAN) tablet 1 mg  1 mg Oral Q4H PRN   ??? zolpidem (AMBIEN) tablet 10 mg  10 mg Oral QHS PRN   ??? acetaminophen (TYLENOL) tablet 650 mg  650 mg Oral Q4H PRN   ??? ibuprofen (MOTRIN) tablet 400 mg  400 mg Oral Q8H PRN   ??? magnesium hydroxide (MILK OF MAGNESIA) oral suspension 30 mL  30 mL Oral DAILY PRN   ??? nicotine (NICODERM CQ) 21 mg/24 hr patch 1 Patch  1 Patch TransDERmal DAILY PRN            Objective:     No data found.            Physical Exam: BP 108/67   Pulse 75   Temp(Src) 97.6 ??F (36.4 ??C)   Resp 16   Ht 5\' 7"  (1.702 m)   Wt 77.565 kg (171 lb)   BMI 26.78 kg/m2   SpO2 98%   Breastfeeding? No  General:  Alert, cooperative, no distress, appears stated age.   Head:  Normocephalic, without obvious abnormality, atraumatic.   Eyes:  Conjunctivae/corneas clear. PERRL, EOMs intact.            Lungs:   Diminished breath sounds.   Chest wall:  No tenderness or deformity.   Heart:  Regular rate and rhythm, S1, S2 normal, no murmur, click, rub or gallop.   Abdomen:   Soft, non-tender. Bowel sounds normal. No masses,  No organomegaly.           Extremities:  Extremities normal, atraumatic, no cyanosis or edema.   Pulses: 2+ and symmetric all extremities.   Skin: Skin color, texture, turgor normal. No rashes or lesions               Recent Results (from the past 24 hour(s))   METABOLIC PANEL, COMPREHENSIVE    Collection Time     04/17/13  6:00 AM       Result Value Range    Sodium 140  136 - 145 mmol/L    Potassium 3.8  3.5 - 5.1 mmol/L    Chloride 105  97 - 108 mmol/L    CO2 27  21 - 32 mmol/L    Anion gap 8  5 - 15 mmol/L    Glucose 86  65 - 100 mg/dL    BUN 8  6 - 20 MG/DL    Creatinine 0.98  1.19 - 1.15 MG/DL  BUN/Creatinine ratio 11 (*) 12 - 20      GFR est AA >60  >60 ml/min/1.37m2    GFR est non-AA >60  >60 ml/min/1.54m2    Calcium 8.5  8.5 - 10.1 MG/DL    Bilirubin, total 0.3  0.2 - 1.0 MG/DL    ALT 20  12 - 78 U/L    AST 15  15 - 37 U/L    Alk. phosphatase 39 (*) 45 - 117 U/L    Protein, total 7.0  6.4 - 8.2 g/dL    Albumin 3.1 (*) 3.5 - 5.0 g/dL    Globulin 3.9  2.0 - 4.0 g/dL    A-G Ratio 0.8 (*) 1.1 - 2.2     GLUCOSE, FASTING    Collection Time     04/17/13  6:00 AM       Result Value Range    Glucose 86  65 - 100 MG/DL   CBC W/O DIFF    Collection Time     04/17/13  6:00 AM       Result Value Range    WBC 4.3  3.6 - 11.0 K/uL    RBC 3.99  3.80 - 5.20 M/uL    HGB 12.2  11.5 - 16.0 g/dL    HCT 16.1  09.6 - 04.5 %    MCV 91.5  80.0 - 99.0 FL    MCH 30.6  26.0 - 34.0 PG    MCHC 33.4  30.0 - 36.5 g/dL    RDW 40.9  81.1 - 91.4 %    PLATELET 339  150 - 400 K/uL   TSH, 3RD GENERATION    Collection Time     04/17/13  6:00 AM       Result Value Range    TSH 0.42  0.36 - 3.74 uIU/mL   LIPID PANEL    Collection Time     04/17/13  6:00 AM       Result Value Range    LIPID PROFILE          Cholesterol, total 178  <200 MG/DL    Triglyceride 66  <782 MG/DL    HDL Cholesterol 59      LDL, calculated 105.8 (*) 0 - 100 MG/DL    VLDL, calculated 95.6      CHOL/HDL Ratio 3.0  0 - 5.0           Assessment:     Principal Problem:    Psychosis (04/16/2013)    Active Problems:     HIV (human immunodeficiency virus infection) (04/17/2013)      Marijuana abuse (04/17/2013)      Marijuana intoxication (04/17/2013)      Mood disorder (04/17/2013)      Overview: NOS      HTN (hypertension) (04/17/2013)      Tobacco dependence (04/17/2013)      TBI (traumatic brain injury) (04/17/2013)    URI/bronchitis    Plan:     Bactrim DS one po bid.    Robitussin cough syrup.
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
## 2013-04-17 NOTE — Behavioral Health Treatment Team (Signed)
Patient had poor eye contact,poor concentration,patient was staring in space when talking with this Clinical research associate.Anxiety noted by pressured speech and some hand wringing.Flat affect,looks depressed,denies suicidal,homicidal ideation, says she does not want to hurt self or no one else.Guarded,sits alone in dayroom,no interaction with her peers,will talk with staff to get needs met.Patient is requesting a smoking patch,encouraged patient to check with med nurse to see if patch is ordered.Did attended group,sat quietly and offered no feed back.Staff will continue to monitor patient on Q 15 minute checks for safety and will give assistance as needed.At the present time,patient iss med,meal complaint.
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
## 2013-04-17 NOTE — Behavioral Health Treatment Team (Signed)
TRANSFER - IN REPORT:    Verbal report received from Ms. Pam on Traci [name]  being received from [facility] for routine progression of care      Report consisted of patient???s Situation, Background, Assessment and   Recommendations(SBAR).     Information from the following report(s) SBAR was reviewed with the receiving nurse.    Opportunity for questions and clarification was provided.      Assessment completed upon patient???s arrival to unit and care assumed.

## 2013-04-17 NOTE — Behavioral Health Treatment Team (Signed)
Admission lab work collected and sent to lab.

## 2013-04-17 NOTE — Behavioral Health Treatment Team (Cosign Needed)
The patient Traci [name] is participating in Relaxation Group.    Group time: 30 minutes    Personal goal for participation: Personal    Goal orientation: To learn to relax    Group therapy participation: Active    Therapeutic interventions reviewed and discussed: Yes    ETHEL BARNETT-JOHNSON  04/17/2013

## 2013-04-17 NOTE — Behavioral Health Treatment Team (Signed)
GROUP THERAPY PROGRESS NOTE    Traci [name] is participating in Coping Skills Group.     Group time: 30 minutes.    Personal goal for participation: Discharge planning.    Goal orientation: personal.    Group therapy participation: active, with minimal prompting.    Therapeutic interventions reviewed and discussed: Initially the patient wanted to sit by herself and listen to the group discussion. After a few minutes of listening and minimal prompting, she actively participated in the group. She said she had recently been transferred to the general unit from the acute unit. She shared appropriately when asked what she would do if she missed one of her medications - she knew it was important "to keep is my head and take my next dose when it is scheduled." She said she knew she would be engaging in less self-destructive or inappropriate behaviors if she is on her right medication, so "I don't get so depressed or upset."        Impression of participation: The patient is capable of participating in her discharge planning but may need some assistance in identifying sources of social/community support.

## 2013-04-17 NOTE — Behavioral Health Treatment Team (Cosign Needed)
GROUP THERAPY PROGRESS NOTE    Traci [name] is participating in [place].     Group time: 30 minutes    Personal goal for participation: daily orientation    Goal orientation: personal    Group therapy participation: minimal    Therapeutic interventions reviewed and discussed: yes    Impression of participation: needed prompting

## 2013-04-18 MED ADMIN — pantoprazole (PROTONIX) tablet 40 mg: ORAL | @ 10:00:00 | NDC 00093001298

## 2013-04-18 MED ADMIN — hydrochlorothiazide (HYDRODIURIL) tablet 25 mg: ORAL | @ 12:00:00 | NDC 68084008601

## 2013-04-18 MED ADMIN — lisinopril (PRINIVIL, ZESTRIL) tablet 20 mg: ORAL | @ 12:00:00 | NDC 68084019811

## 2013-04-18 MED ADMIN — zolpidem (AMBIEN) tablet 10 mg: ORAL | @ 02:00:00 | NDC 51079072401

## 2013-04-18 MED ADMIN — guaiFENesin (ROBITUSSIN) liquid 100 mg: ORAL | @ 02:00:00 | NDC 00121174410

## 2013-04-18 MED ADMIN — magnesium hydroxide (MILK OF MAGNESIA) oral suspension 30 mL: ORAL | @ 02:00:00 | NDC 00121043130

## 2013-04-18 MED ADMIN — acetaminophen (TYLENOL) tablet 650 mg: ORAL | @ 13:00:00 | NDC 50580048790

## 2013-04-18 MED ADMIN — trimethoprim-sulfamethoxazole (BACTRIM DS, SEPTRA DS) 160-800 mg per tablet 1 Tab: ORAL | @ 13:00:00 | NDC 68084023011

## 2013-04-18 NOTE — Behavioral Health Treatment Team (Cosign Needed)
GROUP THERAPY PROGRESS NOTE    The patient Traci [name] a 47 y.o. female is participating in Coping Skills Group.     Group time: 45 minutes    Personal goal for participation: To participate in relaxation activity    Goal orientation:  relaxation    Group therapy participation: active    Therapeutic interventions reviewed and discussed: yes    Impression of participation:  The patient was attentive.    BEVERLY S BAKER  04/18/2013  5:07 PM

## 2013-04-18 NOTE — Behavioral Health Treatment Team (Signed)
Quietly resting in bed, no respiratory distress noted.  No c/o inability to sleep.  No c/o pain/discomfort.  Chart check completed.
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
## 2013-04-18 NOTE — Progress Notes (Signed)
Problem: Altered Thought Process (Adult/Pediatric)  Goal: *STG: Remains safe in hospital  Outcome: Resolved/Met Date Met:  04/18/13  Pt has been visible on the unit all shift.  She complained of pain around 2000 and was given motrin and two heat packs for her knee.  Pt later stated she felt much better.  Pt is calm, cooperative and taking all ordered medications.  She is worried about her child because her boy friend had to go to the hospital today.  Continue to monitor every fifteen minutes for safety.

## 2013-04-18 NOTE — ED Provider Notes (Addendum)
I personally saw and examined the patient.  I have reviewed and agree with the MLP's findings, including all diagnostic interpretations, and plans as written.   I was present during the key portions of separately billed procedures.    Marsheila Alejo S Ifeanyi Mickelson, MD

## 2013-04-18 NOTE — Behavioral Health Treatment Team (Signed)
Art Therapy Group    Traci [name] attended Art Therapy Group    Group Time: 45 minutes    Treatment Goal for Each Participant:Use creative means to explore current goals, or use session to explore art as a means of distraction and/or relaxation.     Participation    active    Engagement with Peers   moderate     Impression of Participation: Pleasant and Traci [name], of encouragement to select peers; used art for relaxation purposes. Patient completed two very well done drawings using artist's charcoal and pastels; patient worked with the technique of using a gum eraser to add dimension and [name] to drawings. Patient was allowed to continue to use materials after group session ended.

## 2013-04-18 NOTE — Behavioral Health Treatment Team (Signed)
Slept approx 7.5 hrs since 2200.

## 2013-04-18 NOTE — Behavioral Health Treatment Team (Signed)
Psychiatric Progress Note      Date: 04/18/2013  Account Number:  [id number]  Name: Traci [name]      PSYCHOTHERAPY SESSION:  Length of psychotherapy session: 25 minutes  Interpersonal relationship issues and psychodynamic conflicts explored.  Supportive psychotherapy provided in regards to various ongoing psychosocial stressors (including some of the following):   pre-admission and current problems   Housing issues   Occupational issues   Academic issues   Legal issues   Medical issues   Interpersonal conflicts   Stress of hospitalization              Worked on issues of denial & effects of substance dependency/use  Cognitive/Behavioral therapy provided  Reality-Oriented psychotherapy provided                                 E & M PROGRESS NOTE:         SUBJECTIVE:   CC: depression and anxiety    HPI/Interval History: (reviewed/updated 04/18/2013)    The patient Traci [name] is a 47 y.o. female with a history of anxiety dis nos and medical hx sig for HIV who reports/evidences the following emotional symptoms: depression, psychotic behavior and anxiety. The symptoms have been present for several months and are of severe severity. The symptoms are intermittent/ fleeting in nature. Additional symptoms include mood lability, anxiety attacks and avoidance of crowds and likely precipitated by treatment noncompliance and psychosocial stressors and made worse by MJ use. Very regressive behaviors noted. Mood lability noted this am.      Review of Systems:  (reviewed/updated 04/18/2013)  Appetite:good   Sleep: good   All other Review of Systems: - Respiratory ROS: no cough, shortness of breath, or wheezing  negative  Cardiovascular ROS: no chest pain or dyspnea on exertion  Neurological ROS: no TIA or stroke symptoms, negative    Side Effects: (reviewed/updated 04/18/2013)  None reported or admitted to.  No noted toxicity with use of Depakote/Tegretol/lithium    Past Medical History: (reviewed/updated 04/18/2013)  Active  Ambulatory Problems     Diagnosis Date Noted   ??? No Active Ambulatory Problems     Resolved Ambulatory Problems     Diagnosis Date Noted   ??? No Resolved Ambulatory Problems     Past Medical History   Diagnosis Date   ??? Hypertension    ??? Gastrointestinal disorder    ??? Other ill-defined conditions    ??? Depression    ??? Psychotic disorder    ??? Substance abuse    ??? Anxiety disorder    ??? GERD (gastroesophageal reflux disease)    ??? Autoimmune disease      Past medical history has been reviewed (see dictated report) with no additional updates (I asked patient and no additional past medical history provided).    Social History: (reviewed/updated 04/18/2013)   reports that she has been smoking.  She uses smokeless tobacco. She reports that  drinks alcohol. She reports that she uses illicit drugs (Marijuana).  History     Social History Narrative   ??? No narrative on file     Social history has been reviewed (see dictated report) with no additional updates (I asked patient and no additional social history provided).    Family History: (reviewed/updated 04/18/2013)  Family history has been reviewed (see dictated report) with no additional updates (I asked patient and no additional family history provided).    OBJECTIVE:  MENTAL STATUS EXAM:  (reviewed/updated 04/18/2013 with changes noted below)  FINDINGS WITHIN NORMAL LIMITS (WNL) UNLESS OTHERWISE STATED BELOW:    Orientation not oriented to situation, oriented to time, place and person   Vital Signs (BP,Pulse, Temp) See below (reviewed)   Gait and Station Within normal limits   Abnormal Muscular Movements/Tone/Behavior No EPS, no Tardive Dyskinesia, no abnormal muscular movements; wnl tone   Relations cooperative, unreliable and vague   General Appearance:  age appropriate and casually dressed   Language No aphasia or dysarthria   Speech:  regressive tone   Thought Processes logical, wnl rate of thoughts, good abstract reasoning and computation   Thought  Associations circumstantial, goal directed, logical and tangential   Thought Content free of delusions and free of hallucinations   Suicidal Ideations none   Homicidal Ideations none   Mood:  anxious, labile and sad   Affect:  euthymic, labile and sad   Memory recent  adequate   Memory remote:  adequate   Concentration/Attention:  adequate   Fund of Knowledge Fair/average   Insight:  limited   Reliability poor   Judgment:  limited     Pertinent data/vitals:  Patient Vitals for the past 24 hrs:   Temp Pulse Resp BP   04/18/13 0300 97.8 ??F (36.6 ??C) 70 18 135/92 mmHg     Labs Reviewed   DRUG SCREEN, URINE - Abnormal; Notable for the following:     THC (TH-CANNABINOL) POSITIVE (*)     All other components within normal limits   METABOLIC PANEL, COMPREHENSIVE - Abnormal; Notable for the following:     BUN/Creatinine ratio 11 (*)     Alk. phosphatase 39 (*)     Albumin 3.1 (*)     A-G Ratio 0.8 (*)     All other components within normal limits   LIPID PANEL - Abnormal; Notable for the following:     LDL, calculated 105.8 (*)     All other components within normal limits   LYMPHOCYTES, CD4 PERCENT AND ABSOLUTE - Abnormal; Notable for the following:     PLATELET 436 (*)     All other components within normal limits   POC CHEM8 - Abnormal; Notable for the following:     Calcium, ionized (POC) 1.10 (*)     Anion gap (POC) 17 (*)     Glucose (POC) 120 (*)     BUN (POC) <3 (*)     All other components within normal limits   ETHYL ALCOHOL   GLUCOSE, FASTING   CBC W/O DIFF   TSH, 3RD GENERATION   HIV-1 RNA QT BY PCR   HCG URINE, QL. - POC     XR Results (most recent):    Results from Hospital Encounter encounter on 04/16/13   XR CHEST PA LAT   Narrative **Final Report**      ICD Codes / Adm.Diagnosis: 298.9  9 / Unspecified psychosis  Anxiety  Examination:  CR CHEST PA AND LATERAL  - [id number] - Apr 18 2013  9:36AM  Accession No:  [id number]  Reason:        REPORT:  EXAM:  CR CHEST PA AND LATERAL    INDICATION:    COMPARISON: None.     FINDINGS: PA and lateral radiographs of the chest demonstrate clear lungs.   The cardiac and mediastinal contours and pulmonary vascularity are normal.    Spondylitic changes are present. Subchondral cyst formation is present in   both greater tuberosities.  IMPRESSION: No acute cardiopulmonary process seen.           Signing/Reading Doctor: Monico Blitz [id number])    Approved: Monico Blitz [id number])  Apr 18 2013  9:48AM                                      Allergies:  No Known Allergies    Medications:  Current Facility-Administered Medications   Medication Dose Route Frequency   ??? trimethoprim-sulfamethoxazole (BACTRIM DS, SEPTRA DS) 160-800 mg per tablet 1 Tab  1 Tab Oral Q12H   ??? pantoprazole (PROTONIX) tablet 40 mg  40 mg Oral ACB   ??? lisinopril (PRINIVIL, ZESTRIL) tablet 20 mg  20 mg Oral DAILY   ??? hydrochlorothiazide (HYDRODIURIL) tablet 25 mg  25 mg Oral DAILY   ??? guaiFENesin (ROBITUSSIN) liquid 100 mg  100 mg Oral Q4H PRN   ??? ziprasidone (GEODON) 20 mg in sterile water (preservative free) injection  20 mg IntraMUSCular BID PRN   ??? OLANZapine (ZYPREXA) tablet 5 mg  5 mg Oral Q6H PRN   ??? benztropine (COGENTIN) tablet 2 mg  2 mg Oral BID PRN   ??? benztropine (COGENTIN) injection 2 mg  2 mg IntraMUSCular Q12H PRN   ??? LORazepam (ATIVAN) injection 2 mg  2 mg IntraMUSCular Q4H PRN   ??? LORazepam (ATIVAN) tablet 1 mg  1 mg Oral Q4H PRN   ??? zolpidem (AMBIEN) tablet 10 mg  10 mg Oral QHS PRN   ??? acetaminophen (TYLENOL) tablet 650 mg  650 mg Oral Q4H PRN   ??? ibuprofen (MOTRIN) tablet 400 mg  400 mg Oral Q8H PRN   ??? magnesium hydroxide (MILK OF MAGNESIA) oral suspension 30 mL  30 mL Oral DAILY PRN   ??? nicotine (NICODERM CQ) 21 mg/24 hr patch 1 Patch  1 Patch TransDERmal DAILY PRN       Scheduled Medications:  Current Facility-Administered Medications   Medication Dose Route Frequency   ??? trimethoprim-sulfamethoxazole (BACTRIM DS, SEPTRA DS) 160-800 mg per tablet 1 Tab  1 Tab Oral Q12H   ??? pantoprazole (PROTONIX)  tablet 40 mg  40 mg Oral ACB   ??? lisinopril (PRINIVIL, ZESTRIL) tablet 20 mg  20 mg Oral DAILY   ??? hydrochlorothiazide (HYDRODIURIL) tablet 25 mg  25 mg Oral DAILY         ASSESSMENT/PLAN:   Patient is still with a severe exacerbation of condition which is not improving  Diagnoses:  Patient Active Hospital Problem List:   rule out Psychosis NOS (04/16/2013)    Assessment: less psychotic appearing today. ? MJ induced. Still with sig mood lability---?bipolar dis.    Plan: further eval before rushing to start mood stabilizer or antipsychotics. Gather more collateral info.   HIV (human immunodeficiency virus infection) (04/17/2013)    Assessment: stable. CD4 count wnl    Plan: ID consult   Marijuana abuse (04/17/2013)    Assessment: ? 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 MDD with psychosis (04/17/2013)    Assessment: unclear dx still    Plan: further eval and collateral info   HTN (hypertension) (04/17/2013)    Assessment: stable    Plan: cont with anti-htn meds   Tobacco dependence (04/17/2013)    Assessment: h/o    Plan: nicoderm  TBI (traumatic brain injury) (04/17/2013)    Assessment: h/o. ? Contributing to cognitive and psychiatric disturbances as observed above. Work h/o not fully c/w such.    Plan: cont to gather collateral info        A coordinated, multidisplinary treatment team round was conducted with the patient; that include the nurse, unit pharmcist, social worker and Clinical research associate all present.   The following regarding medications was addressed during rounds with patient:   the risks and benefits of the proposed medication. The patient was given the opportunity to ask questions. Informed consent given to the use of the above medications. Will continue to adjust psychiatric and non-psychiatric medications (see above "medication" section and orders section for details) as deemed appropriate & based upon  diagnoses and response to treatment.     Will continue to order blood tests/labs and diagnostic tests as deemed appropriate and review results as they become available (see orders for details and above listed lab/test results).    Will order psychiatric records from previous psych hospitals to further elucidate the nature of patient's psychopathology and review once available.    Will gather additional collateral information from  friends, family and o/p treatment team to further elucidate the nature of patient's psychopathology and baselline level of psychiatric functioning.      Complete current electronic health record for patient was reviewed including consultant notes, ancillary staff notes, nurses and psychiatric tech notes    Will continue to provide individual, milieu, occupational, group, and   substance abuse therapies to address target symptoms as deemed   appropriate for the individual patient.      EXPECTED DISCHARGE DATE (Day): TBD    DISPOSITION: Home    Signed By: Mackie Pai, MD

## 2013-04-19 MED ADMIN — zolpidem (AMBIEN) tablet 10 mg: ORAL | @ 02:00:00 | NDC 51079072401

## 2013-04-19 MED ADMIN — ibuprofen (MOTRIN) tablet 400 mg: ORAL | @ 02:00:00 | NDC 63739044210

## 2013-04-19 MED ADMIN — pantoprazole (PROTONIX) tablet 40 mg: ORAL | @ 11:00:00 | NDC 00093001298

## 2013-04-19 MED ADMIN — trimethoprim-sulfamethoxazole (BACTRIM DS, SEPTRA DS) 160-800 mg per tablet 1 Tab: ORAL | @ 02:00:00 | NDC 68084023011

## 2013-04-19 MED ADMIN — trimethoprim-sulfamethoxazole (BACTRIM DS, SEPTRA DS) 160-800 mg per tablet 1 Tab: ORAL | @ 12:00:00 | NDC 68084023011

## 2013-04-19 MED ADMIN — lisinopril (PRINIVIL, ZESTRIL) tablet 20 mg: ORAL | @ 12:00:00 | NDC 68084019811

## 2013-04-19 MED ADMIN — hydrochlorothiazide (HYDRODIURIL) tablet 25 mg: ORAL | @ 12:00:00 | NDC 68084008611

## 2013-04-19 MED ADMIN — guaiFENesin (ROBITUSSIN) liquid 100 mg: ORAL | @ 02:00:00 | NDC 00121174410

## 2013-04-19 NOTE — Behavioral Health Treatment Team (Signed)
Slept approx 8 hrs since 2200.

## 2013-04-19 NOTE — Behavioral Health Treatment Team (Signed)
Up to bathroom X 1 and returned to bed s/out incident.  Otherwise, Pt has been resting quietly in bed.  No c/o inability to sleep.  No c/o pain/discomfort.  Chart check completed.
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
## 2013-04-19 NOTE — Behavioral Health Treatment Team (Cosign Needed)
GROUP THERAPY PROGRESS NOTE    The patient Traci [name] a 47 y.o. female is participating in Coping Skills Group.     Group time: 45 minutes    Personal goal for participation: To participate in anger bingo game    Goal orientation:  personal    Group therapy participation: active    Therapeutic interventions reviewed and discussed: causes,symptoms,consequences,control and prevention of anger    Impression of participation:  The patient was attentive.    BEVERLY S BAKER  04/19/2013  4:29 PM

## 2013-04-19 NOTE — Behavioral Health Treatment Team (Signed)
GROUP THERAPY PROGRESS NOTE    Traci [name] is participating in [place].     Group time: 15 minutes    Personal goal for participation: Educational    Goal orientation: community    Group therapy participation: minimal    Therapeutic interventions reviewed and discussed: yes    Impression of participation: good

## 2013-04-19 NOTE — Behavioral Health Treatment Team (Signed)
Pt is alert and oriented x 3.  Mood is calm.  Pt denies suicidal/homicidal ideations.  There are no signs of psychotic behavior observed on this shift.  Pt was briefed on discharge instructions without questions.  Staff will continue to monitor until pt is discharged today.

## 2013-04-19 NOTE — Discharge Summary (Signed)
Patient seen, staffing held and discharge summary dictated: # [id number]  Please see dictated report for full details. Patient stable for discharge and considered to be at low risk of harm to self or others.  Informed consent given to the use of discharge medications.  Treatment rounds held in full.  I have spent greater than 35 minutes on discharge work.

## 2013-04-19 NOTE — Behavioral Health Treatment Team (Addendum)
Social Work    Discharge noted for today. Patient calm and cooperative, oriented x3. Mood mildly anxious, remains somewhat regressed and child-like. Patient denies suicidal and homicidal ideation. No evidence of psychosis. Patient will be returning to her residence at which she resides with her boyfriend.    Reviewed aftercare plan with follow-up at [facility] for a walk-in appointment Monday April 22, 2013. Left voicemail message with Moises Blood at [facility] that patient would likely be a good candidate for the [facility].    Patient's friend Hansel Starling [phone]) will pick up patient at 1530.    (1500) Correction   Discharge Plan changed: see note 1444 completed by Toma Copier, LCSW for amended discharge plan.

## 2013-04-19 NOTE — Behavioral Health Treatment Team (Signed)
Social  Work  Discharge noted for today.  The patient is aware of the plan and states readiness.  She denies current s/h's a/v's.  Thought process is slow but goal directed.  She remains anxious.    Ms Sherpa will return to her residence and her fiancee's sister will provide transportation home.  With the patient's permission, spoke with Ms. Chelsea Aus who reports that the patient at home is calmer but has anxiety.    Ms Stlaurent states intent to comply with out patient follow up and maintain medication compliance

## 2013-04-20 LAB — LYMPHOCYTES, CD4 PERCENT AND ABSOLUTE
% CD 4 Pos Lymph: 31.8 % (ref 30.8–58.5)
ABS. BASOPHILS: 0 10*3/uL (ref 0.0–0.2)
ABS. EOSINOPHILS: 0.1 10*3/uL (ref 0.0–0.4)
ABS. IMM. GRANS.: 0 10*3/uL (ref 0.0–0.1)
ABS. MONOCYTES: 0.4 10*3/uL (ref 0.1–0.9)
ABS. NEUTROPHILS: 2.7 10*3/uL (ref 1.4–7.0)
Abs CD4 Helper: 509 /uL (ref 359–1519)
Abs Lymphocytes: 1.6 10*3/uL (ref 0.7–3.1)
BASOPHILS: 0 % (ref 0–3)
EOSINOPHILS: 1 % (ref 0–5)
HCT: 39.7 % (ref 34.0–46.6)
HGB: 13.5 g/dL (ref 11.1–15.9)
IMMATURE GRANULOCYTES: 0 % (ref 0–2)
Lymphocytes: 34 % (ref 14–46)
MCH: 30.7 pg (ref 26.6–33.0)
MCHC: 34 g/dL (ref 31.5–35.7)
MCV: 90 fL (ref 79–97)
MONOCYTES: 8 % (ref 4–12)
NEUTROPHILS: 57 % (ref 40–74)
PLATELET: 436 10*3/uL — ABNORMAL HIGH (ref 150–379)
RBC: 4.4 x10E6/uL (ref 3.77–5.28)
RDW: 13.9 % (ref 12.3–15.4)
WBC: 4.8 10*3/uL (ref 3.4–10.8)

## 2013-04-20 LAB — HIV-1 RNA QT BY PCR
HIV-1 RNA by PCR: 40 copies/mL
HIV-1 RNA by PCR: 40 copies/mL
HIV-1 RNA log10 Copies/mL: 1.602 log10copy/mL
LOG10 HIV-1 RNA,550424: 1.602 log10copy/mL
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
## 2013-05-28 NOTE — Patient Instructions (Signed)
This patient has concerns regarding long-term memory loss. Will investigate with lab work EEG brain MRI, and neuropsychological testing. Revisit once completed.

## 2013-05-29 LAB — METABOLIC PANEL, COMPREHENSIVE
A-G Ratio: 1.5 (ref 1.1–2.5)
ALT (SGPT): 19 IU/L (ref 0–32)
AST (SGOT): 24 IU/L (ref 0–40)
Albumin: 4.1 g/dL (ref 3.5–5.5)
Alk. phosphatase: 36 IU/L — ABNORMAL LOW (ref 39–117)
BUN/Creatinine ratio: 6 — ABNORMAL LOW (ref 9–23)
BUN: 5 mg/dL — ABNORMAL LOW (ref 6–24)
Bilirubin, total: 0.2 mg/dL (ref 0.0–1.2)
CO2: 27 mmol/L (ref 18–29)
Calcium: 8.8 mg/dL (ref 8.7–10.2)
Chloride: 102 mmol/L (ref 97–108)
Creatinine: 0.79 mg/dL (ref 0.57–1.00)
GFR est AA: 105 mL/min/{1.73_m2} (ref >59)
GFR est non-AA: 91 mL/min/{1.73_m2} (ref >59)
GLOBULIN, TOTAL: 2.7 g/dL (ref 1.5–4.5)
Glucose: 81 mg/dL (ref 65–99)
Potassium: 4.4 mmol/L (ref 3.5–5.2)
Protein, total: 6.8 g/dL (ref 6.0–8.5)
Sodium: 141 mmol/L (ref 134–144)

## 2013-05-29 LAB — VITAMIN B12 & FOLATE
Folate: 9.4 ng/mL (ref >3.0)
Vitamin B12: 537 pg/mL (ref 211–946)

## 2013-05-29 LAB — CBC WITH AUTOMATED DIFF
ABS. BASOPHILS: 0 10*3/uL (ref 0.0–0.2)
ABS. EOSINOPHILS: 0.1 10*3/uL (ref 0.0–0.4)
ABS. IMM. GRANS.: 0 10*3/uL (ref 0.0–0.1)
ABS. MONOCYTES: 0.4 10*3/uL (ref 0.1–0.9)
ABS. NEUTROPHILS: 2.5 10*3/uL (ref 1.4–7.0)
Abs Lymphocytes: 1.7 10*3/uL (ref 0.7–3.1)
BASOPHILS: 0 % (ref 0–3)
EOSINOPHILS: 1 % (ref 0–5)
HCT: 36.3 % (ref 34.0–46.6)
HGB: 11.9 g/dL (ref 11.1–15.9)
IMMATURE GRANULOCYTES: 0 % (ref 0–2)
Lymphocytes: 36 % (ref 14–46)
MCH: 30.7 pg (ref 26.6–33.0)
MCHC: 32.8 g/dL (ref 31.5–35.7)
MCV: 94 fL (ref 79–97)
MONOCYTES: 8 % (ref 4–12)
NEUTROPHILS: 55 % (ref 40–74)
PLATELET: 412 10*3/uL — ABNORMAL HIGH (ref 150–379)
RBC: 3.88 x10E6/uL (ref 3.77–5.28)
RDW: 14.3 % (ref 12.3–15.4)
WBC: 4.6 10*3/uL (ref 3.4–10.8)

## 2013-05-29 LAB — TSH 3RD GENERATION: TSH: 0.534 u[IU]/mL (ref 0.450–4.500)

## 2013-06-07 NOTE — Procedures (Signed)
This was an elective routine EEG for evaluation of memory concerns. Routine scalp leads were applied according to the 10 - 20 international system. EKG monitor as well.    The background rhythm was a consistent 8 to 10 Hz modulating well with eye-opening and closure. No evidence of focal slowing or spontaneous electrocerebral discharges. EKG grossly sinus rhythm. Hyperventilation evoked no change. Photic stimulation produced a well-defined photic drive at different harmonics.    Impression: this is a normal awake routine EEG as described. Clinical correlation is advised. JJHMD.

## 2013-06-07 NOTE — Progress Notes (Signed)
EEG completed

## 2013-06-07 NOTE — Procedures (Signed)
This was an elective routine EEG for evaluation of memory concerns. Routine scalp leads were applied according to the 10 ??? 20 international system. EKG monitor as well.    The background rhythm was a consistent 8 to 10 Hz modulating well with eye-opening and closure. No evidence of focal slowing or spontaneous electrocerebral discharges. EKG grossly sinus rhythm. Hyperventilation evoked no change. Photic stimulation produced a well-defined photic drive at different harmonics.    Impression: this is a normal awake routine EEG as described. Clinical correlation is advised. JJHMD.

## 2013-06-07 NOTE — Patient Instructions (Signed)
For EEG only

## 2013-06-07 NOTE — Progress Notes (Signed)
for EEG only
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
## 2013-09-04 NOTE — Progress Notes (Signed)
Neurology Consult      Subjective:      Traci [name] is a 47 y.o. female who comes in following memory evaluation. The lab work EEG and brain MRI were read normal. The neuropsychological testing did not validate memory loss. It was apparent she was under significant emotional distress and may have mild chronic ADD. There was a suggestion for review of psychiatric medications and management for bipolar psychosis and anxiety. There was a strong wording for weekly psychotherapy that needed to be in place. There was increased risk for self harm and there was no evidence of ongoing neurocognitive residual from traumatic brain injury.  She has an appointment to see psychiatry on January 15 and I strongly urged her and her caseworker to keep that appointment.          Current Outpatient Prescriptions   Medication Sig Dispense Refill   ??? divalproex DR (DEPAKOTE) 500 mg tablet Take 750 mg by mouth two (2) times a day.       ??? ibuprofen (MOTRIN) 800 mg tablet Take  by mouth.       ??? amlodipine (NORVASC) 10 mg tablet Take  by mouth daily.       ??? trazodone (DESYREL) 50 mg tablet Take 100 mg by mouth nightly.       ??? hydrOXYzine (VISTARIL) 25 mg capsule Take 25 mg by mouth three (3) times daily as needed for Itching.       ??? lisinopril (PRINIVIL, ZESTRIL) 20 mg tablet Take 20 mg by mouth daily.         ??? hydrochlorothiazide (HYDRODIURIL) 25 mg tablet Take 25 mg by mouth daily.         ??? cyclobenzaprine (FLEXERIL) 10 mg tablet Take  by mouth three (3) times daily as needed for Muscle Spasm(s).       ??? omeprazole (PRILOSEC) 20 mg capsule Take 20 mg by mouth daily.       ??? citalopram (CELEXA) 10 mg tablet Take  by mouth daily.          No Known Allergies  Past Medical History   Diagnosis Date   ??? Hypertension    ??? Gastrointestinal disorder      GERD   ??? Other ill-defined conditions      HIV   ??? Depression    ??? Psychotic disorder    ??? Substance abuse    ??? Anxiety disorder    ??? GERD (gastroesophageal reflux disease)    ???  Autoimmune disease      HIV   ??? Arthritis    ??? Headache    ??? Nervous breakdown 04/16/13   ??? Asthma    ??? HIV (human immunodeficiency virus infection)    ??? Fall    ??? Fatigue    ??? Memory change       Past Surgical History   Procedure Laterality Date   ??? Hx gyn       Tubal ligation   ??? Hx orthopaedic       Left knee      History     Social History   ??? Marital Status: SINGLE     Spouse Name: N/A     Number of Children: N/A   ??? Years of Education: N/A     Occupational History   ??? Not on file.     Social History Main Topics   ??? Smoking status: Current Some Day Smoker -- 30 years   ??? Smokeless tobacco: Current  User   ??? Alcohol Use: No   ??? Drug Use: Yes     Special: Marijuana   ??? Sexually Active: Not on file     Other Topics Concern   ??? Not on file     Social History Narrative   ??? No narrative on file      History reviewed. No pertinent family history.   BP 138/78   Pulse 104   Resp 22   Ht 5\' 7"  (1.702 m)   Wt 80.423 kg (177 lb 4.8 oz)   BMI 27.76 kg/m2   SpO2 99%   LMP 08/31/2013     Review of Systems:   A comprehensive review of systems was negative except for that written in the HPI.      Neuro Exam:     Appearance:  The patient is well developed, well nourished, provides a partial coherent history and is in no acute distress.   Mental Status: Oriented to time, place and person. Mood and affect reveal patient to be friendly and cooperative but somber.   Cranial Nerves:   Intact visual fields. Fundi are benign. PERLA, EOM's full, no nystagmus, no ptosis. Facial sensation is normal. Corneal reflexes are intact. Facial movement is symmetric. Hearing is normal bilaterally. Palate is midline with normal sternocleidomastoid and trapezius muscles are normal. Tongue is midline.   Motor:  5/5 strength in upper and lower proximal and distal muscles. Normal bulk and tone. No fasciculations.   Reflexes:   Deep tendon reflexes 2+/4 and symmetrical.   Sensory:   Normal to touch, pinprick and vibration.   Gait:  Normal gait.   Tremor:    No tremor noted.   Cerebellar:  No cerebellar signs present.   Neurovascular:  Normal heart sounds and regular rhythm, peripheral pulses intact, and no carotid bruits.            Assessment:   Memory concerns. Not validated by testing. Please see progress notes. Should keep January 15 appointment to see psychiatry for bipolar psychosis and anxiety. May have a mild element of ADD.      Plan:   Revisit as needed.  Signed by :  Milana Na, MD 09/04/2013

## 2013-09-04 NOTE — Patient Instructions (Addendum)
PRESCRIPTION REFILL POLICY    [facility]   Statement to Patients  December 18, 2012      In an effort to ensure the large volume of patient prescription refills is processed in the most efficient and expeditious manner, we are asking our patients to assist [place] by calling your Pharmacy for all prescription refills, this will include also your  Mail Order Pharmacy. The pharmacy will contact our office electronically to continue the refill process.    Please do not wait until the last minute to call your pharmacy. We need at least 48 hours (2days) to fill prescriptions. We also encourage you to call your pharmacy before going to pick up your prescription to make sure it is ready.     With regard to controlled substance prescription refill requests (narcotic refills) that need to be picked up at our office, we ask your cooperation by providing [place] with at least 72 hours (3days) notice that you will need a refill.    We will not refill narcotic prescription refill requests after 4:00pm on any weekday, Monday through Thursday, or after 2:00pm on Fridays, or on the weekends.      We encourage everyone to explore another way of getting your prescription refill request processed using MyChart, our patient web portal through our electronic medical record system. MyChart is an efficient and effective way to communicate your medication request directly to the office and  downloadable as an app on your smart phone . MyChart also features a review functionality that allows you to view your medication list as well as leave messages for your physician. Are you ready to get connected? If so please review the attatched instructions or speak to any of our staff to get you set up right away!    Thank you so much for your cooperation. Should you have any questions please contact our Research officer, political party.    The Physicians and Staff,  [facility]   If we have ordered testing for you, we do not call patients  with results and we do not give test results over the phone.  We schedule follow up appointments so that your results can be discussed in person and any questions you have regarding them may be addressed.  If something of concern is revealed on your test, we will call you for a sooner follow up appointment.  Additionally, results may be found by using the My Chart feature and one of our patient service representatives at the front desk can give you instructions on how to access this feature of our electronic medical record system.    This patient's testing for memory concerns did not validate memory loss. She will be following up with psychiatry and will be getting counseling shortly. She will be seen here as needed.

## 2013-10-22 NOTE — Progress Notes (Signed)
Subjective:   Traci [name] is a 48 y.o. female who presents for inital HIV evaluation. Pt referred by Dr Bobette Mo     When was first positive test for HIV? February, 2004    Previous HIV Negative Test: 2003    What was the reason for being tested?HIV test for pregnancy    Mode of HIV infection:Heterosexual contact    Patient's usual source of health care:ID Specialist    Other STD's:chlamydia as an adult- over 20 yrs ago    Oppurtunistic Infections: none    Sexually transmitted diseases: Chlamydia    Tuberculosis: Date of last PPD    ROS  no specific complaints, no fever, chills , sweats , weight loss ,     No Known Allergies    Current Outpatient Prescriptions   Medication Sig Dispense Refill   ??? NAPROXEN PO Take  by mouth.       ??? divalproex DR (DEPAKOTE) 500 mg tablet Take 750 mg by mouth two (2) times a day.       ??? ibuprofen (MOTRIN) 800 mg tablet Take  by mouth.       ??? amlodipine (NORVASC) 10 mg tablet Take  by mouth daily.       ??? cyclobenzaprine (FLEXERIL) 10 mg tablet Take  by mouth three (3) times daily as needed for Muscle Spasm(s).       ??? citalopram (CELEXA) 10 mg tablet Take  by mouth daily.       ??? trazodone (DESYREL) 50 mg tablet Take 100 mg by mouth nightly.       ??? hydrOXYzine (VISTARIL) 25 mg capsule Take 25 mg by mouth three (3) times daily as needed for Itching.       ??? lisinopril (PRINIVIL, ZESTRIL) 20 mg tablet Take 20 mg by mouth daily.         ??? hydrochlorothiazide (HYDRODIURIL) 25 mg tablet Take 25 mg by mouth daily.             Past Medical History   Diagnosis Date   ??? Hypertension    ??? Gastrointestinal disorder      GERD   ??? Other ill-defined conditions      HIV   ??? Depression    ??? Psychotic disorder    ??? Substance abuse    ??? Anxiety disorder    ??? GERD (gastroesophageal reflux disease)    ??? Autoimmune disease      HIV   ??? Arthritis    ??? Headache    ??? Nervous breakdown 04/16/13   ??? Asthma    ??? HIV (human immunodeficiency virus infection)    ??? Fall    ??? Fatigue    ??? Memory change         History     Social History   ??? Marital Status: SINGLE     Spouse Name: N/A     Number of Children: N/A   ??? Years of Education: N/A     Occupational History   ??? Not on file.     Social History Main Topics   ??? Smoking status: Current Some Day Smoker -- 30 years   ??? Smokeless tobacco: Current User   ??? Alcohol Use: No   ??? Drug Use: Yes     Special: Marijuana   ??? Sexually Active: Not on file     Other Topics Concern   ??? Not on file     Social History Narrative   ??? No narrative on file  Drug used discussed yes    Is the patient sexually active? yes    Safer sex guidelines discussed yes    If yes, partner(s)  aware of patient's status? yes    Gyn history: Have you ever been pregnant? yes    Psychiatric history.depression and bipolar    Vaccination History: unknown    Objective:     BP 155/101   Pulse 76   Temp(Src) 97.7 ??F (36.5 ??C) (Oral)   Resp 20   Ht 5' 7" (1.702 m)   Wt 173 lb (78.472 kg)   BMI 27.09 kg/m2   SpO2 96%   LMP 09/20/2013  Skin: no rashes or significant lesions  Oropharynx: Oropharynx clear and dentition normal  Lymph: no adenopathy  Lungs: clear to auscultation  Heart: regular rate and rhythm and no murmurs, clicks, or gallops  Abdomen: abdomen soft, non-tender  Neuro Exam: alert & oriented x 3 with fluent speech  Extremities: less then 2 second capillary refill      Assessment:     1. HIV (human immunodeficiency virus infection)          Plan:       ICD-9-CM    1. HIV (human immunodeficiency virus infection) V08 CBC WITH AUTOMATED DIFF     METABOLIC PANEL, COMPREHENSIVE     CMV AB, IGM     CMV AB, IGG     TOXOPLASMA GONDII AB, IGG     TOXOPLASMA GONDII AB, IGM     CRYPTOCOCCUS AG W/REFLEX TITER     HEPATITIS PANEL, ACUTE     LYMPHOCYTES, CD4 PERCENT AND ABSOLUTE     HIV-1 RNA QT BY PCR     HIV 1/2 AB, BY IMMUNOBLOT     RPR     HLA [id number] GENOTYPING     HEPATITIS B SURF AB QUANT       I have discussed the diagnosis with the patient and the intended plan as seen in the above orders.     The patient  has received an after-visit summary and questions were answered concerning future plans.     I have discussed medication side effects and warnings with the patient as well.    Reviewed test results at length with patient        Orders Placed This Encounter   ??? CRYPTOCOCCUS AG W/REFLEX TITER   ??? CBC WITH AUTOMATED DIFF   ??? METABOLIC PANEL, COMPREHENSIVE   ??? CMV AB, IGM   ??? CMV AB, IGG   ??? TOXOPLASMA GONDII AB, IGG   ??? TOXOPLASMA GONDII AB, IGM   ??? HEPATITIS PANEL, ACUTE   ??? LYMPHOCYTES, CD4 PERCENT AND ABSOLUTE   ??? HIV-1 RNA QT BY PCR   ??? HIV 1/2 AB, BY IMMUNOBLOT   ??? RPR   ??? HLA [id number] GENOTYPING   ??? HEPATITIS B SURF AB QUANT   ??? NAPROXEN PO       Follow-up Disposition:  Return in about 3 weeks (around 11/12/2013) for HIV , results.      Herma Mering, MD  [facility]  [address]  [place] [address]     Pt counseled about HIv , transmission , safe sexual practices , drug use

## 2013-10-22 NOTE — Patient Instructions (Addendum)
MyChart Activation    Thank you for requesting access to MyChart. Please follow the instructions below to securely access and download your online medical record. MyChart allows you to send messages to your doctor, view your test results, renew your prescriptions, schedule appointments, and more.    How Do I Sign Up?    1. In your internet browser, go to [email or web address]  2. Click on the First Time User? Click Here link in the Sign In box. You will be redirect to the New Member Sign Up page.  3. Enter your MyChart Access Code exactly as it appears below. You will not need to use this code after you???ve completed the sign-up process. If you do not sign up before the expiration date, you must request a new code.    MyChart Access Code: [email or web address]  Expires: 10/27/2013 10:54 AM (This is the date your MyChart access code will expire)    4. Enter the last four digits of your Social Security Number (xxxx) and Date of Birth (mm/dd/yyyy) as indicated and click Submit. You will be taken to the next sign-up page.  5. Create a MyChart ID. This will be your MyChart login ID and cannot be changed, so think of one that is secure and easy to remember.  6. Create a MyChart password. You can change your password at any time.  7. Enter your Password Reset Question and Answer. This can be used at a later time if you forget your password.   8. Enter your e-mail address. You will receive e-mail notification when new information is available in MyChart.  9. Click Sign Up. You can now view and download portions of your medical record.  10. Click the Download Summary menu link to download a portable copy of your medical information.    Additional Information    If you have questions, please visit the Frequently Asked Questions section of the MyChart website at [email or web address] Remember, MyChart is NOT to be used for urgent needs. For medical emergencies, dial 911.       Safe sex, alaways use condoms

## 2013-10-22 NOTE — Progress Notes (Signed)
1. Have you been to the ER, urgent care clinic since your last visit?  Hospitalized since your last visit?No    2. Have you seen or consulted any other health care providers outside of the [facility] since your last visit?  Include any pap smears or colon screening. No

## 2013-10-24 ENCOUNTER — Encounter

## 2013-10-29 LAB — HEPATITIS PANEL, ACUTE
HEP C VIRUS AB: <0.1 s/co ratio (ref 0.0–0.9)
Hep B Core Ab, IgM: NEGATIVE
Hep B surface Ag screen: NEGATIVE
Hepatitis A Ab, IgM: NEGATIVE

## 2013-10-29 LAB — LYMPHOCYTES, CD4 PERCENT AND ABSOLUTE
% CD 4 Pos Lymph: 36.1 % (ref 30.8–58.5)
ABS. BASOPHILS: 0 10*3/uL (ref 0.0–0.2)
ABS. EOSINOPHILS: 0.1 10*3/uL (ref 0.0–0.4)
ABS. IMM. GRANS.: 0 10*3/uL (ref 0.0–0.1)
ABS. MONOCYTES: 0.3 10*3/uL (ref 0.1–0.9)
ABS. NEUTROPHILS: 0.8 10*3/uL — ABNORMAL LOW (ref 1.4–7.0)
Abs CD4 Helper: 614 /uL (ref 359–1519)
Abs Lymphocytes: 1.7 10*3/uL (ref 0.7–3.1)
BASOPHILS: 0 %
EOSINOPHILS: 2 %
HCT: 38.2 % (ref 34.0–46.6)
HGB: 12.7 g/dL (ref 11.1–15.9)
IMMATURE GRANULOCYTES: 0 %
Lymphocytes: 61 %
MCH: 29.7 pg (ref 26.6–33.0)
MCHC: 33.2 g/dL (ref 31.5–35.7)
MCV: 90 fL (ref 79–97)
MONOCYTES: 10 %
NEUTROPHILS: 27 %
PLATELET: 384 10*3/uL — ABNORMAL HIGH (ref 150–379)
RBC: 4.27 x10E6/uL (ref 3.77–5.28)
RDW: 14.7 % (ref 12.3–15.4)
WBC: 2.8 10*3/uL — ABNORMAL LOW (ref 3.4–10.8)

## 2013-10-29 LAB — CMV AB, IGG: CYTOMEGALOVIRUS (CMV) AB, IGG: 5.4 U/mL — ABNORMAL HIGH (ref 0.00–0.59)

## 2013-10-29 LAB — METABOLIC PANEL, COMPREHENSIVE
A-G Ratio: 1.5 (ref 1.1–2.5)
ALT (SGPT): 19 IU/L (ref 0–32)
AST (SGOT): 21 IU/L (ref 0–40)
Albumin: 4.4 g/dL (ref 3.5–5.5)
Alk. phosphatase: 36 IU/L — ABNORMAL LOW (ref 39–117)
BUN/Creatinine ratio: 11 (ref 9–23)
BUN: 10 mg/dL (ref 6–24)
Bilirubin, total: <0.2 mg/dL (ref 0.0–1.2)
CO2: 27 mmol/L (ref 18–29)
Calcium: 9.1 mg/dL (ref 8.7–10.2)
Chloride: 100 mmol/L (ref 97–108)
Creatinine: 0.88 mg/dL (ref 0.57–1.00)
GFR est AA: 92 mL/min/{1.73_m2} (ref >59)
GFR est non-AA: 80 mL/min/{1.73_m2} (ref >59)
GLOBULIN, TOTAL: 3 g/dL (ref 1.5–4.5)
Glucose: 88 mg/dL (ref 65–99)
Potassium: 4.4 mmol/L (ref 3.5–5.2)
Protein, total: 7.4 g/dL (ref 6.0–8.5)
Sodium: 140 mmol/L (ref 134–144)

## 2013-10-29 LAB — HIV-1 RNA QT BY PCR
HIV-1 RNA by PCR: 240 copies/mL
HIV-1 RNA by PCR: 240 copies/mL
HIV-1 RNA log10 Copies/mL: 2.38 log10copy/mL
LOG10 HIV-1 RNA,550424: 2.38 log10copy/mL

## 2013-10-29 LAB — CRYPTOCOCCUS ANTIGEN, SERUM: Cryptococcus Antigen, Serum: NEGATIVE

## 2013-10-29 LAB — TOXOPLASMA GONDII AB, IGG: Toxoplasma gondii AB, IgG, QT: 28.3 IU/mL — ABNORMAL HIGH (ref 0.0–5.9)

## 2013-10-29 LAB — HIV 1/2 AB, BY IMMUNOBLOT
HIV 1/O/2 Abs-Index Value: >50 — ABNORMAL HIGH (ref <1.00)
HIV 1/O/2 Abs: >50 — ABNORMAL HIGH (ref <1.00)

## 2013-10-29 LAB — HIV 1/2 SUPPLEMENTAL AB TEST
HIV 1 AB, HIV4T: REACTIVE — AB
HIV 2 AB, HIV5T: NONREACTIVE
HIV-1 Ab: REACTIVE — AB
HIV-2 Ab: NONREACTIVE

## 2013-10-29 LAB — TOXOPLASMA GONDII AB, IGM: Toxoplasma gondii Ab, IgM, QT: <3 AU/mL (ref 0.0–7.9)

## 2013-10-29 LAB — CMV AB, IGM: CYTOMEGALOVIRUS (CMV) AB, IGM: <30 AU/mL (ref 0.0–29.9)

## 2013-10-29 LAB — HLA B*5701 GENOTYPING
HLA-B*5701: NEGATIVE
RESULT, HLA-B 5701, 006930: NEGATIVE

## 2013-10-29 LAB — RPR
RPR: NONREACTIVE
RPR: NONREACTIVE

## 2013-10-29 LAB — COMMENT

## 2013-11-26 MED ORDER — EFAVIRENZ-EMTRICITABIN-TENOFOVIR 600 MG-200 MG-300 MG TAB
600-200-300 mg | ORAL_TABLET | Freq: Every evening | ORAL | Status: DC
Start: 2013-11-26 — End: 2014-07-25

## 2013-11-26 NOTE — Progress Notes (Signed)
1. Have you been to the ER, urgent care clinic since your last visit?  Hospitalized since your last visit?No    2. Have you seen or consulted any other health care providers outside of the [facility] since your last visit?  Include any pap smears or colon screening. No

## 2013-11-26 NOTE — Patient Instructions (Addendum)
MyChart Activation    Thank you for requesting access to MyChart. Please follow the instructions below to securely access and download your online medical record. MyChart allows you to send messages to your doctor, view your test results, renew your prescriptions, schedule appointments, and more.    How Do I Sign Up?    1. In your internet browser, go to [email or web address].  2. Click on the First Time User? Click Here link in the Sign In box. You will see the New Member Sign Up page.  3. Enter your MyChart Access Code exactly as it appears below. You will not need to use this code after you???ve completed the sign-up process. If you do not sign up before the expiration date, you must request a new code.    MyChart Access Code: [email or web address]  Expires: 02/13/2014 12:55 PM (This is the date your MyChart access code will expire)    4. Enter the last four digits of your Social Security Number (xxxx) and Date of Birth (mm/dd/yyyy) as indicated and click Submit. You will be taken to the next sign-up page.  5. Create a MyChart ID. This will be your MyChart login ID and cannot be changed, so think of one that is secure and easy to remember.  6. Create a MyChart password. You can change your password at any time.  7. Enter your Password Reset Question and Answer. This can be used at a later time if you forget your password.   8. Enter your e-mail address. You will receive e-mail notification when new information is available in MyChart.  9. Click Sign Up. You can now view and download portions of your medical record.  10. Click the Download Summary menu link to download a portable copy of your medical information.    Additional Information    If you have questions, please visit the Frequently Asked Questions section of the MyChart website at [email or web address] Remember, MyChart is NOT to be used for urgent needs. For medical emergencies, dial 911.      Exercise daily  Anxiety  Disorder: After Your Visit  Your Care Instructions  Anxiety is a normal reaction to stress. Difficult situations can cause you to have symptoms such as sweaty palms and a nervous feeling.  In an anxiety disorder, the symptoms are far more severe. Constant worry, muscle tension, trouble sleeping, nausea and diarrhea, and other symptoms can make normal daily activities difficult or impossible. These symptoms may occur for no reason, and they can affect your work, school, or social life. Medicines, counseling, and self-care can all help.  Follow-up care is a key part of your treatment and safety. Be sure to make and go to all appointments, and call your doctor if you are having problems. It's also a good idea to know your test results and keep a list of the medicines you take.  How can you care for yourself at home?  ?? Take medicines exactly as directed. Call your doctor if you think you are having a problem with your medicine.  ?? Go to your counseling sessions and follow-up appointments.  ?? Recognize and accept your anxiety. Then, when you are in a situation that makes you anxious, say to yourself, "This is not an emergency. I feel uncomfortable, but I am not in danger. I can keep going even if I feel anxious."  ?? Be kind to your body:  ?? Relieve tension with exercise or a massage.  ?? Get enough rest.  ?? Avoid alcohol, caffeine,  nicotine, and illegal drugs. They can increase your anxiety level and cause sleep problems.  ?? Learn and do relaxation techniques. See below for more about these techniques.  ?? Engage your mind. Get out and do something you enjoy. Go to a funny movie, or take a walk or hike. Plan your day. Having too much or too little to do can make you anxious.  ?? Keep a record of your symptoms. Discuss your fears with a good friend or family member, or join a support group for people with similar problems. Talking to others sometimes relieves stress.  ?? Get involved in social groups, or volunteer to help  others. Being alone sometimes makes things seem worse than they are.  ?? Get at least 30 minutes of exercise on most days of the week to relieve stress. Walking is a good choice. You also may want to do other activities, such as running, swimming, cycling, or playing tennis or team sports.  Relaxation techniques  Do relaxation exercises 10 to 20 minutes a day. You can play soothing, relaxing music while you do them, if you wish.  ?? Tell others in your house that you are going to do your relaxation exercises. Ask them not to disturb you.  ?? Find a comfortable place, away from all distractions and noise.  ?? Lie down on your back, or sit with your back straight.  ?? Focus on your breathing. Make it slow and steady.  ?? Breathe in through your nose. Breathe out through either your nose or mouth.  ?? Breathe deeply, filling up the area between your navel and your rib cage. Breathe so that your belly goes up and down.  ?? Do not hold your breath.  ?? Breathe like this for 5 to 10 minutes. Notice the feeling of calmness throughout your whole body.  As you continue to breathe slowly and deeply, relax by doing the following for another 5 to 10 minutes:  ?? Tighten and relax each muscle group in your body. You can begin at your toes and work your way up to your head.  ?? Imagine your muscle groups relaxing and becoming heavy.  ?? Empty your mind of all thoughts.  ?? Let yourself relax more and more deeply.  ?? Become aware of the state of calmness that surrounds you.  ?? When your relaxation time is over, you can bring yourself back to alertness by moving your fingers and toes and then your hands and feet and then stretching and moving your entire body. Sometimes people fall asleep during relaxation, but they usually wake up shortly afterward.  ?? Always give yourself time to return to full alertness before you drive a car or do anything that might cause an accident if you are not fully alert. Never play a relaxation tape while you drive  a car.  When should you call for help?  Call 911 anytime you think you may need emergency care. For example, call if:  ?? You feel you cannot stop from hurting yourself or someone else.  Watch closely for changes in your health, and be sure to contact your doctor if:  ?? You have anxiety or fear that affects your life.  ?? You have symptoms of anxiety that are new or different from those you had before.   Where can you learn more?   Go to [email or web address]  Enter P754 in the search box to learn more about "Anxiety Disorder: After Your Visit."   ?? 2006-2014 Healthwise, Incorporated.  Care instructions adapted under license by [employer] (which disclaims liability or warranty for this information). This care instruction is for use with your licensed healthcare professional. If you have questions about a medical condition or this instruction, always ask your healthcare professional. Healthwise, Incorporated disclaims any warranty or liability for your use of this information.  Content Version: 10.2.346038; Current as of: April 24, 2012            Exercise daily

## 2013-12-04 NOTE — Telephone Encounter (Signed)
Pt notified

## 2013-12-04 NOTE — Telephone Encounter (Signed)
Pt states  She is having side effects from the atripla.  She cann't get out of bed, no energy, nausea, headache and feeling withdrawn. Pt# [phone]

## 2013-12-04 NOTE — Telephone Encounter (Signed)
Please ask her to take last thing at night .  Then rest as needed. Continue taking at least 14 days . If still having symptoms we can change , but usually side effects go away. If she feels she cannot do so & needs to change medication right away  , let me know please .

## 2013-12-16 NOTE — Progress Notes (Signed)
Subjective:    Traci [name] is a 48 y.o. female who presents for follow up of HIV.   Patient has  Not been on ART. Now ready to start medication . Discussed need for medication compliance . Discussed briefly HIV life cycle , replication . Mutation of virus. Also discussed how medications work, side effects , dosing schedule  . Pt voiced understanding & stated she is ready to start therapy     Review of Systems:   Pertinent Positives: Admits to no specific complaints   Pertinent Negatives: Denies fevers, chills, night sweats, change in appetite, unexplained weight loss, swollen lymph nodes, painful swallowing, abdominal pain, vomiting, diarrhea, cough, shortness of breath, rash or numbness or tingling of hands or feet   Is the patient sexually active? yes   Patient Active Problem List    Diagnosis Date Noted   ??? Severe bipolar affective disorder with psychosis 08/21/2013   ??? Generalized anxiety disorder 08/21/2013   ??? Posttraumatic stress disorder 08/21/2013   ??? 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 Memory loss 05/28/2013   ??? Headache 05/28/2013   ??? HIV (human immunodeficiency virus infection) 04/17/2013   ??? Marijuana abuse 04/17/2013   ??? Marijuana intoxication 04/17/2013   ??? Mood disorder 04/17/2013   ??? HTN (hypertension) 04/17/2013   ??? Tobacco dependence 04/17/2013   ??? TBI (traumatic brain injury) 04/17/2013   ??? Psychosis 04/16/2013        Past Medical History   Diagnosis Date   ??? Hypertension    ??? Gastrointestinal disorder      GERD   ??? Other ill-defined conditions      HIV   ??? Depression    ??? Psychotic disorder    ??? Substance abuse    ??? Anxiety disorder    ??? GERD (gastroesophageal reflux disease)    ??? Autoimmune disease      HIV   ??? Arthritis    ??? Headache    ??? Nervous breakdown 04/16/13   ??? Asthma    ??? HIV (human immunodeficiency virus infection)    ??? Fall    ??? Fatigue    ??? Memory change        No Known Allergies    Current Outpatient  Prescriptions   Medication Sig Dispense Refill   ??? efavirenz-emtrictabine-tenofovir (ATRIPLA) tablet Take 1 Tab by mouth nightly.  30 Tab  5   ??? NAPROXEN PO Take  by mouth.       ??? divalproex DR (DEPAKOTE) 500 mg tablet Take 750 mg by mouth two (2) times a day.       ??? ibuprofen (MOTRIN) 800 mg tablet Take  by mouth.       ??? amlodipine (NORVASC) 10 mg tablet Take  by mouth daily.       ??? cyclobenzaprine (FLEXERIL) 10 mg tablet Take  by mouth three (3) times daily as needed for Muscle Spasm(s).       ??? citalopram (CELEXA) 10 mg tablet Take  by mouth daily.       ??? trazodone (DESYREL) 50 mg tablet Take 100 mg by mouth nightly.       ??? hydrOXYzine (VISTARIL) 25 mg capsule Take 25 mg by mouth three (3) times daily as needed for Itching.       ??? lisinopril (PRINIVIL, ZESTRIL) 20 mg tablet Take 20 mg by mouth daily.         ??? hydrochlorothiazide (HYDRODIURIL) 25  mg tablet Take 25 mg by mouth daily.             History reviewed. No pertinent family history.    History     Social History   ??? Marital Status: SINGLE     Spouse Name: N/A     Number of Children: N/A   ??? Years of Education: N/A     Occupational History   ??? Not on file.     Social History Main Topics   ??? Smoking status: Former Smoker -- 30 years   ??? Smokeless tobacco: Current User   ??? Alcohol Use: No   ??? Drug Use: Yes     Special: Marijuana   ??? Sexual Activity: Not on file     Other Topics Concern   ??? Not on file     Social History Narrative       Objective:    Blood pressure 152/89, pulse 69, temperature 98.7 ??F (37.1 ??C), temperature source Oral, resp. rate 16, height 5\' 7"  (1.702 m), weight 176 lb (79.833 kg), SpO2 96 %.  Physical Exam:   BP 152/89    Pulse 69    Temp(Src) 98.7 ??F (37.1 ??C) (Oral)    Resp 16    Ht 5\' 7"  (1.702 m)    Wt 176 lb (79.833 kg)    BMI 27.56 kg/m2      SpO2 96%     General appearance  alert, cooperative, no distress, appears stated age   Head  Normocephalic, without obvious abnormality, atraumatic   Eyes  conjunctivae/corneas clear.  PERRL, EOM's intact. Fundi benign   Ears  normal TM's and external ear canals AU   Nose Nares normal. Septum midline. Mucosa normal. No drainage or sinus tenderness.   Throat Lips, mucosa, and tongue normal. Teeth and gums normal   Neck supple, symmetrical, trachea midline, no adenopathy, thyroid: not enlarged, symmetric, no tenderness/mass/nodules, no carotid bruit and no JVD   Back   symmetric, no curvature. ROM normal. No CVA tenderness   Lungs   clear to auscultation bilaterally       Heart  regular rate and rhythm, S1, S2 normal, no murmur, click, rub or gallop   Abdomen   soft, non-tender. Bowel sounds normal. No masses,  No organomegaly   Pelvic Deferred   Extremities extremities normal, atraumatic, no cyanosis or edema   Pulses 2+ and symmetric   Skin Skin color, texture, turgor normal. No rashes or lesions   Lymph nodes Cervical, supraclavicular, and axillary nodes normal.   Neurologic Normal          Assessment/ Plan:        ICD-9-CM    1. HIV (human immunodeficiency virus infection) V08 efavirenz-emtrictabine-tenofovir (ATRIPLA) tablet     HEPATITIS B SURF AB QUANT   2. Immunization deficiency V15.83 PNEUMOCOCCAL POLYSACCHARIDE VACCINE, 23-VALENT, ADULT OR IMMUNOSUPPRESSED PT DOSE,       I have discussed the diagnosis with the patient and the intended plan as seen in the above orders.     The patient has received an after-visit summary and questions were answered concerning future plans.     I have discussed medication side effects and warnings with the patient as well.    Reviewed test results at length with patient         Drug used discussed yes   Safer sex guidelines discussed yes

## 2013-12-28 MED ORDER — SODIUM CHLORIDE 0.65 % NASAL SPRAY AEROSOL
0.65 % | NASAL | Status: AC | PRN
Start: 2013-12-28 — End: ?

## 2013-12-28 MED ORDER — LORATADINE 10 MG TAB
10 mg | Freq: Once | ORAL | Status: AC
Start: 2013-12-28 — End: 2013-12-28
  Administered 2013-12-28: 15:00:00 via ORAL

## 2013-12-28 MED ORDER — CETIRIZINE 10 MG TAB
10 mg | ORAL_TABLET | Freq: Every day | ORAL | Status: AC
Start: 2013-12-28 — End: ?

## 2013-12-28 MED ORDER — AMOXICILLIN 500 MG TABLET
500 mg | ORAL_TABLET | Freq: Three times a day (TID) | ORAL | Status: DC
Start: 2013-12-28 — End: 2014-02-04

## 2013-12-28 MED FILL — LORATADINE 10 MG TAB: 10 mg | ORAL | Qty: 1

## 2013-12-28 NOTE — ED Provider Notes (Signed)
HPI Comments: Pt using flonase and benadryl with no relief    Pt sts zyrtec and amox usu help sx    Patient is a 48 y.o. female presenting with sinus pain and vomiting. The history is provided by the patient. No language interpreter was used.   Sinus Pain   This is a recurrent problem. The current episode started more than 2 days ago. There has been no fever. Associated symptoms include congestion, sinus pressure, sore throat, swollen glands, cough and rhinorrhea. Pertinent negatives include no chills, no sweats, no ear pain, no shortness of breath, no neck pain, no neck pain, no headaches and no chest pain.   Vomiting   This is a new problem. The current episode started yesterday. The problem occurs 2 to 4 times per day. Emesis appearance: mucus - post tussive. There has been no fever. Associated symptoms include cough. Pertinent negatives include no chills, no fever, no sweats, no abdominal pain, no headaches, no arthralgias and no headaches. Her pertinent negatives include no gastric bypass and no DM.        Past Medical History   Diagnosis Date   ??? Hypertension    ??? Gastrointestinal disorder      GERD   ??? Other ill-defined conditions(799.89)      HIV   ??? Depression    ??? Psychotic disorder    ??? Substance abuse    ??? Anxiety disorder    ??? GERD (gastroesophageal reflux disease)    ??? Autoimmune disease (HCC)      HIV   ??? Arthritis    ??? Headache    ??? Nervous breakdown 04/16/13   ??? Asthma    ??? HIV (human immunodeficiency virus infection) (HCC)    ??? Fall    ??? Fatigue    ??? Memory change         Past Surgical History   Procedure Laterality Date   ??? Hx gyn       Tubal ligation   ??? Hx orthopaedic       Left knee         History reviewed. No pertinent family history.     History     Social History   ??? Marital Status: SINGLE     Spouse Name: N/A     Number of Children: N/A   ??? Years of Education: N/A     Occupational History   ??? Not on file.     Social History Main Topics   ??? Smoking status: Former Smoker -- 30 years   ???  Smokeless tobacco: Current User   ??? Alcohol Use: No   ??? Drug Use: Yes     Special: Marijuana      Comment: last use 12/25/13   ??? Sexual Activity: Not on file     Other Topics Concern   ??? Not on file     Social History Narrative                  ALLERGIES: Review of patient's allergies indicates no known allergies.      Review of Systems   Constitutional: Negative for fever and chills.   HENT: Positive for congestion, rhinorrhea, sinus pressure and sore throat. Negative for ear pain, facial swelling and nosebleeds.    Eyes: Negative for visual disturbance.   Respiratory: Positive for cough. Negative for shortness of breath.    Cardiovascular: Negative for chest pain.   Gastrointestinal: Positive for vomiting. Negative for nausea and abdominal pain.  Genitourinary: Negative for dysuria and difficulty urinating.   Musculoskeletal: Negative for back pain, arthralgias and neck pain.   Skin: Negative for rash.   Neurological: Negative for dizziness and headaches.       Filed Vitals:    12/28/13 1017   BP: 135/96   Pulse: 67   Temp: 97.9 ??F (36.6 ??C)   Resp: 18   Height: 5\' 8"  (1.727 m)   Weight: 79.833 kg (176 lb)   SpO2: 100%            Physical Exam   Constitutional: She is oriented to person, place, and time. She appears well-developed and well-nourished.   HENT:   Nose: Mucosal edema, rhinorrhea and sinus tenderness present. Right sinus exhibits frontal sinus tenderness. Right sinus exhibits no maxillary sinus tenderness. Left sinus exhibits frontal sinus tenderness. Left sinus exhibits no maxillary sinus tenderness.   Eyes: EOM are normal. Pupils are equal, round, and reactive to light.   Neck: Normal range of motion.   Cardiovascular: Normal rate, regular rhythm, normal heart sounds and intact distal pulses.    Pulmonary/Chest: Effort normal and breath sounds normal.   Abdominal: Soft. Bowel sounds are normal.   Musculoskeletal: She exhibits no edema or tenderness.   Lymphadenopathy:     She has cervical  adenopathy.        Left cervical: Superficial cervical adenopathy present.   Neurological: She is alert and oriented to person, place, and time.   Skin: Skin is warm and dry.   Psychiatric: She has a normal mood and affect. Her behavior is normal.   Nursing note and vitals reviewed.       MDM  Number of Diagnoses or Management Options  Diagnosis management comments: Sinusitis, allergic rhinitis, bronchitis       Amount and/or Complexity of Data Reviewed  Clinical lab tests: ordered and reviewed        Procedures    I have discussed with patient their diagnosis, treatment, and follow up plan. The patient agrees to follow up as outlined in discharge paperwork and also to return to the ED with any worsening. Bradd Burner, MD

## 2013-12-28 NOTE — ED Notes (Signed)
Discharge instructions and prescriptions reviewed with patient. Medication education provided on scripts. Verbalized understanding. Discharged (ambulatory)  with no distress noted.

## 2013-12-28 NOTE — ED Notes (Signed)
Patient reports with sinus pain, congestion, nasal congestion and vomiting X 5 days.  Patient reports pain increased last night and a lump in her throat last evening.

## 2014-01-07 NOTE — Telephone Encounter (Signed)
atripla doesn't come in liquid per [facility] pharmacist

## 2014-01-07 NOTE — Patient Instructions (Signed)
MyChart Activation    Thank you for requesting access to MyChart. Please follow the instructions below to securely access and download your online medical record. MyChart allows you to send messages to your doctor, view your test results, renew your prescriptions, schedule appointments, and more.    How Do I Sign Up?    1. In your internet browser, go to [email or web address].  2. Click on the First Time User? Click Here link in the Sign In box. You will see the New Member Sign Up page.  3. Enter your MyChart Access Code exactly as it appears below. You will not need to use this code after you???ve completed the sign-up process. If you do not sign up before the expiration date, you must request a new code.    MyChart Access Code: [email or web address]  Expires: 02/13/2014 12:55 PM (This is the date your MyChart access code will expire)    4. Enter the last four digits of your Social Security Number (xxxx) and Date of Birth (mm/dd/yyyy) as indicated and click Submit. You will be taken to the next sign-up page.  5. Create a MyChart ID. This will be your MyChart login ID and cannot be changed, so think of one that is secure and easy to remember.  6. Create a MyChart password. You can change your password at any time.  7. Enter your Password Reset Question and Answer. This can be used at a later time if you forget your password.   8. Enter your e-mail address. You will receive e-mail notification when new information is available in MyChart.  9. Click Sign Up. You can now view and download portions of your medical record.  10. Click the Download Summary menu link to download a portable copy of your medical information.    Additional Information    If you have questions, please visit the Frequently Asked Questions section of the MyChart website at [email or web address] Remember, MyChart is NOT to be used for urgent needs. For medical emergencies, dial 911.       Take whole medication

## 2014-01-07 NOTE — Telephone Encounter (Signed)
Please ask Pharmacy if they have Atripla in liquid form . Pt unable to swallow the pill. tHanks

## 2014-01-07 NOTE — Progress Notes (Signed)
Subjective:    Traci [name] is a 48 y.o. female who presents for follow up of HIV.   Patient has been compliant with ART.  Pt has difficulty swallowing pill.  She has been taking half a pill. Pt advised to take one whole pill at all times Pt advised to mix with apple sauce  Review of Systems:   Pertinent Positives: Admits to  night sweats and change in appetite   Pertinent Negatives: Denies fevers, chills, night sweats, swollen lymph nodes, painful swallowing, abdominal pain, vomiting, diarrhea, cough, shortness of breath, rash or numbness or tingling of hands or feet   Is the patient sexually active? yes   Patient Active Problem List    Diagnosis Date Noted   ??? 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 Generalized anxiety disorder 08/21/2013   ??? Posttraumatic stress disorder 08/21/2013   ??? 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 Memory loss 05/28/2013   ??? Headache 05/28/2013   ??? HIV (human immunodeficiency virus infection) (HCC) 04/17/2013   ??? Marijuana abuse 04/17/2013   ??? Marijuana intoxication (HCC) 04/17/2013   ??? Mood disorder (HCC) 04/17/2013   ??? HTN (hypertension) 04/17/2013   ??? Tobacco dependence 04/17/2013   ??? TBI (traumatic brain injury) (HCC) 04/17/2013   ??? Psychosis 04/16/2013        Past Medical History   Diagnosis Date   ??? Hypertension    ??? Gastrointestinal disorder      GERD   ??? Other ill-defined conditions(799.89)      HIV   ??? Depression    ??? Psychotic disorder    ??? Substance abuse    ??? Anxiety disorder    ??? GERD (gastroesophageal reflux disease)    ??? Autoimmune disease (HCC)      HIV   ??? Arthritis    ??? Headache    ??? Nervous breakdown 04/16/13   ??? Asthma    ??? HIV (human immunodeficiency virus infection) (HCC)    ??? Fall    ??? Fatigue    ??? Memory change        No Known Allergies    Current Outpatient Prescriptions   Medication Sig Dispense Refill   ??? PROAIR HFA 90 mcg/actuation inhaler     0   ???  divalproex ER (DEPAKOTE ER) 250 mg ER tablet     0   ??? gabapentin (NEURONTIN) 300 mg capsule     0   ??? naproxen (NAPROSYN) 500 mg tablet     0   ??? traZODone (DESYREL) 100 mg tablet     0   ??? cetirizine (ZYRTEC) 10 mg tablet Take 1 Tab by mouth daily.  20 Tab  0   ??? sodium chloride (OCEAN) 0.65 % nasal spray 1 Spray by Both Nostrils route as needed for Congestion.  15 mL  0   ??? efavirenz-emtrictabine-tenofovir (ATRIPLA) tablet Take 1 Tab by mouth nightly.  30 Tab  5   ??? divalproex DR (DEPAKOTE) 500 mg tablet Take 750 mg by mouth two (2) times a day.       ??? ibuprofen (MOTRIN) 800 mg tablet Take  by mouth.       ??? amlodipine (NORVASC) 10 mg tablet Take  by mouth daily.       ??? cyclobenzaprine (FLEXERIL) 10 mg tablet Take  by mouth three (3) times daily as needed for Muscle Spasm(s).       ???  citalopram (CELEXA) 10 mg tablet Take  by mouth daily.       ??? hydrOXYzine (VISTARIL) 25 mg capsule Take 25 mg by mouth three (3) times daily as needed for Itching.       ??? lisinopril (PRINIVIL, ZESTRIL) 20 mg tablet Take 20 mg by mouth daily.         ??? hydrochlorothiazide (HYDRODIURIL) 25 mg tablet Take 25 mg by mouth daily.         ??? amoxicillin 500 mg tab Take 500 mg by mouth three (3) times daily.  30 Tab  0       Family History   Problem Relation Age of Onset   ??? Hypertension Mother    ??? Cancer Mother      thyroid cancer   ??? Heart Disease Father    ??? Hypertension Father        History     Social History   ??? Marital Status: SINGLE     Spouse Name: N/A     Number of Children: N/A   ??? Years of Education: N/A     Occupational History   ??? Not on file.     Social History Main Topics   ??? Smoking status: Former Smoker -- 30 years   ??? Smokeless tobacco: Current User   ??? Alcohol Use: No   ??? Drug Use: Yes     Special: Marijuana      Comment: last use 12/25/13   ??? Sexual Activity: Not on file     Other Topics Concern   ??? Not on file     Social History Narrative       Objective:    Blood pressure 147/89, pulse 63, temperature 97.9 ??F (36.6  ??C), temperature source Oral, resp. rate 18, height 5\' 8"  (1.727 m), weight 179 lb 12.8 oz (81.557 kg), last menstrual period 11/20/2013, SpO2 98 %.  Physical Exam:   BP 147/89   Pulse 63   Temp(Src) 97.9 ??F (36.6 ??C) (Oral)   Resp 18   Ht 5\' 8"  (1.727 m)   Wt 179 lb 12.8 oz (81.557 kg)   BMI 27.34 kg/m2   SpO2 98%   LMP 11/20/2013    General appearance  alert, cooperative, no distress, appears stated age   Head  Normocephalic, without obvious abnormality, atraumatic   Eyes  conjunctivae/corneas clear. PERRL, EOM's intact. Fundi benign   Ears  normal TM's and external ear canals AU   Nose Nares normal. Septum midline. Mucosa normal. No drainage or sinus tenderness.   Throat Lips, mucosa, and tongue normal. Teeth and gums normal   Neck supple, symmetrical, trachea midline, no adenopathy, thyroid: not enlarged, symmetric, no tenderness/mass/nodules, no carotid bruit and no JVD   Back   symmetric, no curvature. ROM normal. No CVA tenderness   Lungs   clear to auscultation bilaterally       Heart  regular rate and rhythm, S1, S2 normal, no murmur, click, rub or gallop   Abdomen   soft, non-tender. Bowel sounds normal. No masses,  No organomegaly   Pelvic Deferred   Extremities extremities normal, atraumatic, no cyanosis or edema   Pulses 2+ and symmetric   Skin Skin color, texture, turgor normal. No rashes or lesions   Lymph nodes Cervical, supraclavicular, and axillary nodes normal.   Neurologic Normal          Assessment/ Plan:        ICD-9-CM    1. HIV (human immunodeficiency virus  infection) (HCC) V08    2. Screening-pulmonary TB V74.1 AMB POC TUBERCULOSIS, INTRADERMAL (SKIN TEST)       I have discussed the diagnosis with the patient and the intended plan as seen in the above orders.     The patient has received an after-visit summary and questions were answered concerning future plans.     I have discussed medication side effects and warnings with the patient as well.    Reviewed test results at length with patient          Drug used discussed yes   Safer sex guidelines discussed yes

## 2014-01-08 LAB — HEPATITIS B SURF AB QUANT
HEPATITIS B SURF AB QUANT, 006531: >1000 m[IU]/mL (ref >9.9)
Hepatitis B surf Ab, QT: >1000 m[IU]/mL (ref >9.9)

## 2014-01-08 NOTE — Telephone Encounter (Signed)
Ok , thank you

## 2014-02-04 NOTE — Progress Notes (Signed)
Chief Complaint   Patient presents with   ??? HIV     pt here for follow up on her HIV. she did not come back for her TB test. taking medicine as prescribed.

## 2014-02-04 NOTE — Progress Notes (Signed)
Subjective:    Traci [name] is a 48 y.o. female who presents for follow up of HIV.   Patient has been compliant with ART. She is now able to swallow her pills . No missed doses     Pt missed PPD reading . Pt advised to repeat PPD.    Review of Systems:   Pertinent Positives: Admits to no specific complaints   Pertinent Negatives: Denies fevers, chills, night sweats, change in appetite, unexplained weight loss, swollen lymph nodes, painful swallowing, abdominal pain, vomiting, diarrhea, cough, shortness of breath, rash or numbness or tingling of hands or feet   Is the patient sexually active? yes   Patient Active Problem List    Diagnosis Date Noted   ??? 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 Generalized anxiety disorder 08/21/2013   ??? Posttraumatic stress disorder 08/21/2013   ??? 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 Memory loss 05/28/2013   ??? Headache 05/28/2013   ??? HIV (human immunodeficiency virus infection) (HCC) 04/17/2013   ??? Marijuana abuse 04/17/2013   ??? Marijuana intoxication (HCC) 04/17/2013   ??? Mood disorder (HCC) 04/17/2013   ??? HTN (hypertension) 04/17/2013   ??? Tobacco dependence 04/17/2013   ??? TBI (traumatic brain injury) (HCC) 04/17/2013   ??? Psychosis 04/16/2013        Past Medical History   Diagnosis Date   ??? Hypertension    ??? Gastrointestinal disorder      GERD   ??? Other ill-defined conditions(799.89)      HIV   ??? Depression    ??? Psychotic disorder    ??? Substance abuse    ??? Anxiety disorder    ??? GERD (gastroesophageal reflux disease)    ??? Autoimmune disease (HCC)      HIV   ??? Arthritis    ??? Headache    ??? Nervous breakdown 04/16/13   ??? Asthma    ??? HIV (human immunodeficiency virus infection) (HCC)    ??? Fall    ??? Fatigue    ??? Memory change        No Known Allergies    Current Outpatient Prescriptions   Medication Sig Dispense Refill   ??? PROAIR HFA 90 mcg/actuation inhaler     0   ??? divalproex ER  (DEPAKOTE ER) 250 mg ER tablet     0   ??? gabapentin (NEURONTIN) 300 mg capsule     0   ??? naproxen (NAPROSYN) 500 mg tablet     0   ??? traZODone (DESYREL) 100 mg tablet     0   ??? cetirizine (ZYRTEC) 10 mg tablet Take 1 Tab by mouth daily.  20 Tab  0   ??? sodium chloride (OCEAN) 0.65 % nasal spray 1 Spray by Both Nostrils route as needed for Congestion.  15 mL  0   ??? efavirenz-emtrictabine-tenofovir (ATRIPLA) tablet Take 1 Tab by mouth nightly.  30 Tab  5   ??? divalproex DR (DEPAKOTE) 500 mg tablet Take 750 mg by mouth two (2) times a day.       ??? ibuprofen (MOTRIN) 800 mg tablet Take  by mouth.       ??? amlodipine (NORVASC) 10 mg tablet Take  by mouth daily.       ??? cyclobenzaprine (FLEXERIL) 10 mg tablet Take  by mouth three (3) times daily as needed for Muscle Spasm(s).       ???  citalopram (CELEXA) 10 mg tablet Take  by mouth daily.       ??? hydrOXYzine (VISTARIL) 25 mg capsule Take 25 mg by mouth three (3) times daily as needed for Itching.       ??? lisinopril (PRINIVIL, ZESTRIL) 20 mg tablet Take 20 mg by mouth daily.         ??? hydrochlorothiazide (HYDRODIURIL) 25 mg tablet Take 25 mg by mouth daily.             Family History   Problem Relation Age of Onset   ??? Hypertension Mother    ??? Cancer Mother      thyroid cancer   ??? Heart Disease Father    ??? Hypertension Father        History     Social History   ??? Marital Status: SINGLE     Spouse Name: N/A     Number of Children: N/A   ??? Years of Education: N/A     Occupational History   ??? Not on file.     Social History Main Topics   ??? Smoking status: Former Smoker -- 30 years   ??? Smokeless tobacco: Current User   ??? Alcohol Use: No   ??? Drug Use: Yes     Special: Marijuana      Comment: last use 12/25/13   ??? Sexual Activity: Not on file     Other Topics Concern   ??? Not on file     Social History Narrative       Objective:    Blood pressure 138/94, pulse 72, temperature 98.8 ??F (37.1 ??C), temperature source Oral, resp. rate 20, height 5\' 8"  (1.727 m), weight 178 lb 3.2 oz  (80.831 kg), SpO2 98 %.  Physical Exam:   BP 138/94   Pulse 72   Temp(Src) 98.8 ??F (37.1 ??C) (Oral)   Resp 20   Ht 5\' 8"  (1.727 m)   Wt 178 lb 3.2 oz (80.831 kg)   BMI 27.10 kg/m2   SpO2 98%    General appearance  alert, cooperative, no distress, appears stated age   Head  Normocephalic, without obvious abnormality, atraumatic   Eyes  conjunctivae/corneas clear. PERRL, EOM's intact. Fundi benign   Ears  normal TM's and external ear canals AU   Nose Nares normal. Septum midline. Mucosa normal. No drainage or sinus tenderness.   Throat Lips, mucosa, and tongue normal. Teeth and gums normal   Neck supple, symmetrical, trachea midline, no adenopathy, thyroid: not enlarged, symmetric, no tenderness/mass/nodules, no carotid bruit and no JVD   Back   symmetric, no curvature. ROM normal. No CVA tenderness   Lungs   clear to auscultation bilaterally   Breasts  no masses, tenderness   Heart  regular rate and rhythm, S1, S2 normal, no murmur, click, rub or gallop   Abdomen   soft, non-tender. Bowel sounds normal. No masses,  No organomegaly   Pelvic Deferred   Extremities extremities normal, atraumatic, no cyanosis or edema   Pulses 2+ and symmetric   Skin Skin color, texture, turgor normal. No rashes or lesions   Lymph nodes Cervical, supraclavicular, and axillary nodes normal.   Neurologic Normal          Assessment/ Plan:         ICD-9-CM    1. HIV (human immunodeficiency virus infection) (HCC) V08 LYMPHOCYTES, CD4 PERCENT AND ABSOLUTE     METABOLIC PANEL, COMPREHENSIVE     HIV-1 RNA QT BY PCR  CBC WITH AUTOMATED DIFF   2. Screening-pulmonary TB V74.1 AMB POC TUBERCULOSIS, INTRADERMAL (SKIN TEST)   3. Screening for breast cancer V76.10 MAM MAMMO BI SCREENING DIGTL       I have discussed the diagnosis with the patient and the intended plan as seen in the above orders.     The patient has received an after-visit summary and questions were answered concerning future plans.     I have discussed medication side effects and  warnings with the patient as well.    Reviewed test results at length with patient. HIV labs  Discussed at length with pt        Drug used discussed yes   Safer sex guidelines discussed yes

## 2014-02-04 NOTE — Patient Instructions (Signed)
MyChart Activation    Thank you for requesting access to MyChart. Please follow the instructions below to securely access and download your online medical record. MyChart allows you to send messages to your doctor, view your test results, renew your prescriptions, schedule appointments, and more.    How Do I Sign Up?    1. In your internet browser, go to [email or web address].  2. Click on the First Time User? Click Here link in the Sign In box. You will see the New Member Sign Up page.  3. Enter your MyChart Access Code exactly as it appears below. You will not need to use this code after you???ve completed the sign-up process. If you do not sign up before the expiration date, you must request a new code.    MyChart Access Code: [email or web address]  Expires: 02/13/2014 12:55 PM (This is the date your MyChart access code will expire)    4. Enter the last four digits of your Social Security Number (xxxx) and Date of Birth (mm/dd/yyyy) as indicated and click Submit. You will be taken to the next sign-up page.  5. Create a MyChart ID. This will be your MyChart login ID and cannot be changed, so think of one that is secure and easy to remember.  6. Create a MyChart password. You can change your password at any time.  7. Enter your Password Reset Question and Answer. This can be used at a later time if you forget your password.   8. Enter your e-mail address. You will receive e-mail notification when new information is available in MyChart.  9. Click Sign Up. You can now view and download portions of your medical record.  10. Click the Download Summary menu link to download a portable copy of your medical information.    Additional Information    If you have questions, please visit the Frequently Asked Questions section of the MyChart website at [email or web address] Remember, MyChart is NOT to be used for urgent needs. For medical emergencies, dial 911.      Exercise daily

## 2014-02-05 LAB — METABOLIC PANEL, COMPREHENSIVE
A-G Ratio: 1.5 (ref 1.1–2.5)
ALT (SGPT): 43 IU/L — ABNORMAL HIGH (ref 0–32)
AST (SGOT): 43 IU/L — ABNORMAL HIGH (ref 0–40)
Albumin: 4.5 g/dL (ref 3.5–5.5)
Alk. phosphatase: 52 IU/L (ref 39–117)
BUN/Creatinine ratio: 15 (ref 9–23)
BUN: 13 mg/dL (ref 6–24)
Bilirubin, total: <0.2 mg/dL (ref 0.0–1.2)
CO2: 21 mmol/L (ref 18–29)
Calcium: 9.4 mg/dL (ref 8.7–10.2)
Chloride: 101 mmol/L (ref 97–108)
Creatinine: 0.89 mg/dL (ref 0.57–1.00)
GFR est AA: 91 mL/min/{1.73_m2} (ref >59)
GFR est non-AA: 79 mL/min/{1.73_m2} (ref >59)
GLOBULIN, TOTAL: 3.1 g/dL (ref 1.5–4.5)
Glucose: 92 mg/dL (ref 65–99)
Potassium: 4.4 mmol/L (ref 3.5–5.2)
Protein, total: 7.6 g/dL (ref 6.0–8.5)
Sodium: 137 mmol/L (ref 134–144)

## 2014-02-05 LAB — LYMPHOCYTES, CD4 PERCENT AND ABSOLUTE
% CD 4 Pos Lymph: 36.2 % (ref 30.8–58.5)
ABS. BASOPHILS: 0 10*3/uL (ref 0.0–0.2)
ABS. EOSINOPHILS: 0.1 10*3/uL (ref 0.0–0.4)
ABS. IMM. GRANS.: 0 10*3/uL (ref 0.0–0.1)
ABS. MONOCYTES: 0.3 10*3/uL (ref 0.1–0.9)
ABS. NEUTROPHILS: 1.4 10*3/uL (ref 1.4–7.0)
Abs CD4 Helper: 507 /uL (ref 359–1519)
Abs Lymphocytes: 1.4 10*3/uL (ref 0.7–3.1)
BASOPHILS: 0 %
EOSINOPHILS: 3 %
HCT: 39.4 % (ref 34.0–46.6)
HGB: 13 g/dL (ref 11.1–15.9)
IMMATURE GRANULOCYTES: 0 %
Lymphocytes: 44 %
MCH: 30.2 pg (ref 26.6–33.0)
MCHC: 33 g/dL (ref 31.5–35.7)
MCV: 92 fL (ref 79–97)
MONOCYTES: 8 %
NEUTROPHILS: 45 %
PLATELET: 361 10*3/uL (ref 150–379)
RBC: 4.3 x10E6/uL (ref 3.77–5.28)
RDW: 16.1 % — ABNORMAL HIGH (ref 12.3–15.4)
WBC: 3.1 10*3/uL — ABNORMAL LOW (ref 3.4–10.8)

## 2014-02-06 LAB — AMB POC TUBERCULOSIS, INTRADERMAL (SKIN TEST)
PPD: NEGATIVE Negative
mm Induration: 0 mm

## 2014-02-06 NOTE — Progress Notes (Signed)
Pt is here for ppd reading which is negative

## 2014-02-07 LAB — HIV-1 RNA QT BY PCR
HIV-1 RNA by PCR: <20 copies/mL
HIV-1 RNA by PCR: <20 copies/mL

## 2014-05-06 MED ORDER — POLYETHYLENE GLYCOL 3350 17 GRAM (100 %) ORAL POWDER PACKET
17 gram | PACK | Freq: Every day | ORAL | Status: AC
Start: 2014-05-06 — End: ?

## 2014-05-06 NOTE — Patient Instructions (Signed)
MyChart Activation    Thank you for requesting access to MyChart. Please follow the instructions below to securely access and download your online medical record. MyChart allows you to send messages to your doctor, view your test results, renew your prescriptions, schedule appointments, and more.    How Do I Sign Up?    1. In your internet browser, go to [email or web address].  2. Click on the First Time User? Click Here link in the Sign In box. You will see the New Member Sign Up page.  3. Enter your MyChart Access Code exactly as it appears below. You will not need to use this code after you???ve completed the sign-up process. If you do not sign up before the expiration date, you must request a new code.    MyChart Access Code: [email or web address]  Expires: 07/07/2014  2:05 PM (This is the date your MyChart access code will expire)    4. Enter the last four digits of your Social Security Number (xxxx) and Date of Birth (mm/dd/yyyy) as indicated and click Submit. You will be taken to the next sign-up page.  5. Create a MyChart ID. This will be your MyChart login ID and cannot be changed, so think of one that is secure and easy to remember.  6. Create a MyChart password. You can change your password at any time.  7. Enter your Password Reset Question and Answer. This can be used at a later time if you forget your password.   8. Enter your e-mail address. You will receive e-mail notification when new information is available in MyChart.  9. Click Sign Up. You can now view and download portions of your medical record.  10. Click the Download Summary menu link to download a portable copy of your medical information.    Additional Information    If you have questions, please visit the Frequently Asked Questions section of the MyChart website at [email or web address] Remember, MyChart is NOT to be used for urgent needs. For medical emergencies, dial 911.      Walk daily

## 2014-05-06 NOTE — Progress Notes (Signed)
Subjective:    Traci [name] is a 48 y.o. female who presents for follow up of HIV.   Patient has been compliant with ART. No missed doses. No fever , chills      Pt has constipation . Advised her to drink more fluids , high fiber diet. Pt voiced understanding.    Review of Systems:   Pertinent Positives: Admits to  No specif complaints  Pertinent Negatives: Denies fevers, chills, night sweats, change in appetite, unexplained weight loss, swollen lymph nodes, painful swallowing, abdominal pain, vomiting, diarrhea or cough   Is the patient sexually active? no   Patient Active Problem List    Diagnosis Date Noted   ??? 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 Generalized anxiety disorder 08/21/2013   ??? Posttraumatic stress disorder 08/21/2013   ??? 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 Memory loss 05/28/2013   ??? Headache 05/28/2013   ??? HIV (human immunodeficiency virus infection) (HCC) 04/17/2013   ??? Marijuana abuse 04/17/2013   ??? Marijuana intoxication (HCC) 04/17/2013   ??? Mood disorder (HCC) 04/17/2013   ??? HTN (hypertension) 04/17/2013   ??? Tobacco dependence 04/17/2013   ??? TBI (traumatic brain injury) (HCC) 04/17/2013   ??? Psychosis 04/16/2013        Past Medical History   Diagnosis Date   ??? Hypertension    ??? Gastrointestinal disorder      GERD   ??? Other ill-defined conditions(799.89)      HIV   ??? Depression    ??? Psychotic disorder    ??? Substance abuse    ??? Anxiety disorder    ??? GERD (gastroesophageal reflux disease)    ??? Autoimmune disease (HCC)      HIV   ??? Arthritis    ??? Headache    ??? Nervous breakdown 04/16/13   ??? Asthma    ??? HIV (human immunodeficiency virus infection) (HCC)    ??? Fall    ??? Fatigue    ??? Memory change        No Known Allergies    Current Outpatient Prescriptions   Medication Sig Dispense Refill   ??? polyethylene glycol (MIRALAX) 17 gram packet Take 1 Packet by mouth daily. 90 Packet 3    ??? PROAIR HFA 90 mcg/actuation inhaler   0   ??? divalproex ER (DEPAKOTE ER) 250 mg ER tablet   0   ??? gabapentin (NEURONTIN) 300 mg capsule   0   ??? naproxen (NAPROSYN) 500 mg tablet   0   ??? traZODone (DESYREL) 100 mg tablet   0   ??? cetirizine (ZYRTEC) 10 mg tablet Take 1 Tab by mouth daily. 20 Tab 0   ??? sodium chloride (OCEAN) 0.65 % nasal spray 1 Spray by Both Nostrils route as needed for Congestion. 15 mL 0   ??? efavirenz-emtrictabine-tenofovir (ATRIPLA) tablet Take 1 Tab by mouth nightly. 30 Tab 5   ??? divalproex DR (DEPAKOTE) 500 mg tablet Take 750 mg by mouth two (2) times a day.     ??? ibuprofen (MOTRIN) 800 mg tablet Take  by mouth.     ??? amlodipine (NORVASC) 10 mg tablet Take  by mouth daily.     ??? cyclobenzaprine (FLEXERIL) 10 mg tablet Take  by mouth three (3) times daily as needed for Muscle Spasm(s).     ??? citalopram (CELEXA) 10 mg tablet Take  by mouth daily.     ???  hydrOXYzine (VISTARIL) 25 mg capsule Take 25 mg by mouth three (3) times daily as needed for Itching.     ??? lisinopril (PRINIVIL, ZESTRIL) 20 mg tablet Take 20 mg by mouth daily.       ??? hydrochlorothiazide (HYDRODIURIL) 25 mg tablet Take 25 mg by mouth daily.           Family History   Problem Relation Age of Onset   ??? Hypertension Mother    ??? Cancer Mother      thyroid cancer   ??? Heart Disease Father    ??? Hypertension Father        History     Social History   ??? Marital Status: SINGLE     Spouse Name: N/A     Number of Children: N/A   ??? Years of Education: N/A     Occupational History   ??? Not on file.     Social History Main Topics   ??? Smoking status: Former Smoker -- 30 years   ??? Smokeless tobacco: Current User   ??? Alcohol Use: No   ??? Drug Use: Yes     Special: Marijuana      Comment: last use 12/25/13   ??? Sexual Activity: Not on file     Other Topics Concern   ??? Not on file     Social History Narrative       Objective:    Blood pressure 135/83, pulse 70, temperature 98.1 ??F (36.7 ??C),  temperature source Oral, resp. rate 20, height 5\' 6"  (1.676 m), weight 178 lb 9.6 oz (81.012 kg), SpO2 97 %.  Physical Exam:   BP 135/83 mmHg   Pulse 70   Temp(Src) 98.1 ??F (36.7 ??C) (Oral)   Resp 20   Ht 5\' 6"  (1.676 m)   Wt 178 lb 9.6 oz (81.012 kg)   BMI 28.84 kg/m2   SpO2 97%    General appearance  alert, cooperative, no distress, appears stated age   Head  Normocephalic, without obvious abnormality, atraumatic   Eyes  conjunctivae/corneas clear. PERRL, EOM's intact. Fundi benign   Ears  normal TM's and external ear canals AU   Nose Nares normal. Septum midline. Mucosa normal. No drainage or sinus tenderness.   Throat Lips, mucosa, and tongue normal. Teeth and gums normal   Neck supple, symmetrical, trachea midline, no adenopathy, thyroid: not enlarged, symmetric, no tenderness/mass/nodules, no carotid bruit and no JVD   Back   symmetric, no curvature. ROM normal. No CVA tenderness   Lungs   clear to auscultation bilaterally       Heart  regular rate and rhythm, S1, S2 normal, no murmur, click, rub or gallop   Abdomen   soft, non-tender. Bowel sounds normal. No masses,  No organomegaly   Pelvic Deferred   Extremities extremities normal, atraumatic, no cyanosis or edema   Pulses 2+ and symmetric   Skin Skin color, texture, turgor normal. No rashes or lesions   Lymph nodes Cervical, supraclavicular, and axillary nodes normal.   Neurologic Normal            ICD-9-CM ICD-10-CM    1. HIV (human immunodeficiency virus infection) (HCC) V08 Z21 CBC WITH AUTOMATED DIFF      METABOLIC PANEL, COMPREHENSIVE      LYMPHOCYTES, CD4 PERCENT AND ABSOLUTE      HIV-1 RNA QT BY PCR      LIPID PANEL   2. Constipation - functional 564.09 K59.09 polyethylene glycol (MIRALAX) 17 gram packet  I have discussed the diagnosis with the patient and the intended plan as seen in the above orders.     The patient has received an after-visit summary and questions were answered concerning future plans.      I have discussed medication side effects and warnings with the patient as well.    Reviewed test results at length with patient        I have discussed the diagnosis with the patient and the intended plan as seen in the above orders.     The patient has received an after-visit summary and questions were answered concerning future plans.     I have discussed medication side effects and warnings with the patient as well.    Reviewed test results at length with patient         Drug used discussed yes   Safer sex guidelines discussed yes

## 2014-05-06 NOTE — Progress Notes (Signed)
Chief Complaint   Patient presents with   ??? HIV     follow up on her medication. she state she has been sleeping alot and having a lot of acid reflux since starting the Atripla

## 2014-07-27 MED ORDER — ATRIPLA 600 MG-200 MG-300 MG TABLET
600-200-300 mg | ORAL_TABLET | ORAL | Status: AC
Start: 2014-07-27 — End: ?

## 2014-08-05 ENCOUNTER — Encounter: Attending: Internal Medicine | Primary: Family Medicine

## 2014-08-07 ENCOUNTER — Ambulatory Visit
Admit: 2014-08-07 | Discharge: 2014-08-07 | Payer: PRIVATE HEALTH INSURANCE | Attending: Internal Medicine | Primary: Family Medicine

## 2014-08-07 ENCOUNTER — Inpatient Hospital Stay: Admit: 2014-08-07 | Payer: MEDICAID | Primary: Family Medicine

## 2014-08-07 DIAGNOSIS — B20 Human immunodeficiency virus [HIV] disease: Secondary | ICD-10-CM

## 2014-08-07 NOTE — Progress Notes (Signed)
Subjective:    Traci [name] is a 48 y.o. female who presents for follow up of HIV.   Patient has been compliant with ART.  No missed doses.    Constipation overall better .     Review of Systems:   Pertinent Positives: Admits to no specific complaints   Pertinent Negatives: Denies fevers, chills, night sweats, change in appetite, unexplained weight loss, swollen lymph nodes or painful swallowing   Is the patient sexually active? yes   Patient Active Problem List    Diagnosis Date Noted   ??? 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 Generalized anxiety disorder 08/21/2013   ??? Posttraumatic stress disorder 08/21/2013   ??? 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 Memory loss 05/28/2013   ??? Headache 05/28/2013   ??? HIV (human immunodeficiency virus infection) (HCC) 04/17/2013   ??? Marijuana abuse 04/17/2013   ??? Marijuana intoxication (HCC) 04/17/2013   ??? Mood disorder (HCC) 04/17/2013   ??? HTN (hypertension) 04/17/2013   ??? Tobacco dependence 04/17/2013   ??? TBI (traumatic brain injury) (HCC) 04/17/2013   ??? Psychosis 04/16/2013        Past Medical History   Diagnosis Date   ??? Hypertension    ??? Gastrointestinal disorder      GERD   ??? Other ill-defined conditions(799.89)      HIV   ??? Depression    ??? Psychotic disorder    ??? Substance abuse    ??? Anxiety disorder    ??? GERD (gastroesophageal reflux disease)    ??? Autoimmune disease (HCC)      HIV   ??? Arthritis    ??? Headache    ??? Nervous breakdown 04/16/13   ??? Asthma    ??? HIV (human immunodeficiency virus infection) (HCC)    ??? Fall    ??? Fatigue    ??? Memory change    ??? Schizophrenia (HCC)        No Known Allergies    Current Outpatient Prescriptions   Medication Sig Dispense Refill   ??? ATRIPLA tablet take 1 tablet by mouth NIGHTLY 30 Tab 5   ??? polyethylene glycol (MIRALAX) 17 gram packet Take 1 Packet by mouth daily. 90 Packet 3   ??? PROAIR HFA 90 mcg/actuation inhaler   0    ??? divalproex ER (DEPAKOTE ER) 250 mg ER tablet   0   ??? gabapentin (NEURONTIN) 300 mg capsule   0   ??? naproxen (NAPROSYN) 500 mg tablet   0   ??? traZODone (DESYREL) 100 mg tablet   0   ??? cetirizine (ZYRTEC) 10 mg tablet Take 1 Tab by mouth daily. 20 Tab 0   ??? sodium chloride (OCEAN) 0.65 % nasal spray 1 Spray by Both Nostrils route as needed for Congestion. 15 mL 0   ??? divalproex DR (DEPAKOTE) 500 mg tablet Take 750 mg by mouth two (2) times a day.     ??? ibuprofen (MOTRIN) 800 mg tablet Take  by mouth.     ??? amlodipine (NORVASC) 10 mg tablet Take  by mouth daily.     ??? citalopram (CELEXA) 10 mg tablet Take  by mouth daily.     ??? hydrOXYzine (VISTARIL) 25 mg capsule Take 25 mg by mouth three (3) times daily as needed for Itching.     ??? lisinopril (PRINIVIL, ZESTRIL) 20 mg tablet Take 20 mg by mouth daily.       ???  hydrochlorothiazide (HYDRODIURIL) 25 mg tablet Take 25 mg by mouth daily.       ??? cyclobenzaprine (FLEXERIL) 10 mg tablet Take  by mouth three (3) times daily as needed for Muscle Spasm(s).         Family History   Problem Relation Age of Onset   ??? Hypertension Mother    ??? Cancer Mother      thyroid cancer   ??? Heart Disease Father    ??? Hypertension Father        History     Social History   ??? Marital Status: SINGLE     Spouse Name: N/A     Number of Children: N/A   ??? Years of Education: N/A     Occupational History   ??? Not on file.     Social History Main Topics   ??? Smoking status: Former Smoker -- 30 years   ??? Smokeless tobacco: Current User   ??? Alcohol Use: No   ??? Drug Use: Yes     Special: Marijuana      Comment: last use 12/25/13   ??? Sexual Activity: Not on file     Other Topics Concern   ??? Not on file     Social History Narrative       Objective:    Blood pressure 143/90, pulse 70, temperature 97.3 ??F (36.3 ??C), temperature source Oral, resp. rate 16, height 5\' 6"  (1.676 m), weight 189 lb 9.6 oz (86.002 kg), last menstrual period 08/03/2014, SpO2 99 %.  Physical Exam:    BP 143/90 mmHg   Pulse 70   Temp(Src) 97.3 ??F (36.3 ??C) (Oral)   Resp 16   Ht 5\' 6"  (1.676 m)   Wt 189 lb 9.6 oz (86.002 kg)   BMI 30.62 kg/m2   SpO2 99%   LMP 08/03/2014 (Exact Date)    General appearance  alert, cooperative, no distress, appears stated age   Head  Normocephalic, without obvious abnormality, atraumatic   Eyes  conjunctivae/corneas clear. PERRL, EOM's intact. Fundi benign   Ears  normal TM's and external ear canals AU   Nose Nares normal. Septum midline. Mucosa normal. No drainage or sinus tenderness.   Throat Lips, mucosa, and tongue normal. Teeth and gums normal   Neck supple, symmetrical, trachea midline, no adenopathy, thyroid: not enlarged, symmetric, no tenderness/mass/nodules, no carotid bruit and no JVD   Back   symmetric, no curvature. ROM normal. No CVA tenderness   Lungs   clear to auscultation bilaterally   Breasts  no masses, tenderness   Heart  regular rate and rhythm, S1, S2 normal, no murmur, click, rub or gallop   Abdomen   soft, non-tender. Bowel sounds normal. No masses,  No organomegaly   Pelvic Deferred   Extremities extremities normal, atraumatic, no cyanosis or edema   Pulses 2+ and symmetric   Skin Skin color, texture, turgor normal. No rashes or lesions   Lymph nodes Cervical, supraclavicular, and axillary nodes normal.   Neurologic Normal          Assessment/ Plan:        ICD-10-CM ICD-9-CM    1. HIV (human immunodeficiency virus infection) (HCC) Z21 V08    2. Encounter for immunization Z23 V03.89 INFLUENZA VIRUS VACCINE, PRESERVATIVE FREE SYRINGE, 3 YRS AND OLDER   3. 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  The patient has received an after-visit summary and questions were answered concerning future plans.     I have discussed medication side effects and warnings with the patient as well.    Reviewed test results at length with patient. Safe sex discussed with pt          Drug used discussed yes   Safer sex guidelines discussed yes    Pt had mammogram  done  Earlier this year

## 2014-08-07 NOTE — Patient Instructions (Addendum)
Bipolar Disorder: After Your Visit  Your Care Instructions  Bipolar disorder is an illness that causes extreme mood changes, from times of very high energy (manic episodes) to times of depression. But many people with bipolar disorder show only the symptoms of depression. These moods may cause problems with your work, school, family life, friendships, and how well you function.  This disease is also called manic-depression.  There is no cure for bipolar disorder, but it can be helped with medicines. Counseling may also help. It is important to take your medicines exactly as prescribed, even when you feel well. You may need lifelong treatment.  Follow-up care is a key part of your treatment and safety. Be sure to make and go to all appointments, and call your doctor if you are having problems. It's also a good idea to know your test results and keep a list of the medicines you take.  How can you care for yourself at home?  ?? Be safe with medicines. Take your medicines exactly as prescribed. Do not stop or change a medicine without talking to your doctor first. You and your doctor may need to try different combinations of medicines to find what works for you.  ?? Take your medicines on schedule to keep your moods even. When you feel good, you may think that you do not need your medicines. But it is important to keep taking them.  ?? Go to your counseling sessions. Call and talk with your counselor if you can't go to a session or if you don't think the sessions are helping. Do not just stop going.  ?? Get at least 30 minutes of activity on most days of the week. Walking is a good choice. You also may want to do other things, such as running, swimming, or cycling.  ?? Get enough sleep. Keep your room dark and quiet. Try to go to bed at the same time every night.  ?? Eat a healthy diet. This includes whole grains, dairy, fruits, vegetables, and protein. Eat foods from each of these groups.   ?? Try to lower your stress. Manage your time, build a strong support system, and lead a healthy lifestyle. To lower your stress, try physical activity, slow deep breathing, or getting a massage.  ?? Do not use alcohol or illegal drugs.  ?? Learn the early signs of your mood changes. You can then take steps to help yourself feel better.  ?? Ask for help from friends and family when you need it. You may need help with daily chores when you are depressed. When you are manic, you may need support to control your high energy levels.  What should you do if someone in your family has bipolar disorder?  ?? Learn about the disease and the signs that it is getting worse.  ?? Remind your family member that you love him or her.  ?? Make a plan with all family members about how to take care of your loved one when his or her symptoms are bad.  ?? Talk about your fears and concerns and those of other family members. Seek counseling if needed.  ?? Do not focus attention only on the person who is in treatment.  ?? Remind yourself that it will take time for changes to occur.  ?? Do not blame yourself for the disease.  ?? Know your legal rights and the legal rights of your family member. Support groups or counselors can help you with this information.  ??   Take care of yourself. Keep up with your own interests, such as your career, hobbies, and friends. Use exercise, positive self-talk, deep breathing, and other relaxing exercises to help lower your stress.  ?? Give yourself time to grieve. You may need to deal with emotions such as anger, fear, and frustration. After you work through your feelings, you will be better able to care for yourself and your family.  ?? If you are having a hard time with your feelings or with your relationship with your family member, talk with a counselor.  When should you call for help?  Call 911 anytime you think you may need emergency care. For example, call if:  ?? You feel like hurting yourself or someone else.   ?? Someone who has bipolar disorder displays dangerous behavior, and you think the person might hurt himself or herself or someone else.  Call your doctor now or seek immediate medical care if:  ?? You hear voices.  ?? Someone you know has bipolar disorder and talks about suicide. Keep the numbers for these national suicide hotlines: 1-800-273-TALK (732) 314-2196(1-5617665525) and 1-800-SUICIDE 325-662-7153(1-579-873-6043). If a suicide threat seems real, with a specific plan and a way to carry it out, stay with the person, or ask someone you trust to stay with the person, until you can get help.  ?? Someone you know has bipolar disorder and:  ?? Starts to give away possessions.  ?? Is using illegal drugs or drinking alcohol heavily.  ?? Talks or writes about death, including writing suicide notes or talking about guns, knives, or pills.  ?? Talks or writes about hurting someone else.  ?? Starts to spend a lot of time alone.  ?? Acts very aggressively or suddenly appears calm.  ?? Talks about beliefs that are not based in reality (delusions).  Watch closely for changes in your health, and be sure to contact your doctor if:  ?? You cannot go to your counseling sessions.   Where can you learn more?   Go to [email or web address]  Enter K052 in the search box to learn more about "Bipolar Disorder: After Your Visit."   ?? 2006-2015 [employer]. Care instructions adapted under license by [employer] (which disclaims liability or warranty for this information). This care instruction is for use with your licensed healthcare professional. If you have questions about a medical condition or this instruction, always ask your healthcare professional. Healthwise, Incorporated disclaims any warranty or liability for your use of this information.  Content Version: 10.5.422740; Current as of: August 02, 2013             exercise daily

## 2014-08-07 NOTE — Progress Notes (Signed)
Chief Complaint   Patient presents with   ??? Headache     Pt still having headaches daily   ??? Knee Pain     Pt having bilateral knee pain the left worst than the right.   ??? Sleep Problem     Pt not sleeping.

## 2014-08-08 LAB — CBC WITH AUTOMATED DIFF
ABS. BASOPHILS: 0 10*3/uL (ref 0.0–0.2)
ABS. EOSINOPHILS: 0.1 10*3/uL (ref 0.0–0.4)
ABS. IMM. GRANS.: 0 10*3/uL (ref 0.0–0.1)
ABS. MONOCYTES: 0.5 10*3/uL (ref 0.1–0.9)
ABS. NEUTROPHILS: 1 10*3/uL — ABNORMAL LOW (ref 1.4–7.0)
Abs Lymphocytes: 1.6 10*3/uL (ref 0.7–3.1)
BASOPHILS: 0 %
EOSINOPHILS: 2 %
HCT: 35.9 % (ref 34.0–46.6)
HGB: 11.7 g/dL (ref 11.1–15.9)
IMMATURE GRANULOCYTES: 0 %
Lymphocytes: 51 %
MCH: 30.2 pg (ref 26.6–33.0)
MCHC: 32.6 g/dL (ref 31.5–35.7)
MCV: 93 fL (ref 79–97)
MONOCYTES: 15 %
NEUTROPHILS: 32 %
PLATELET: 382 10*3/uL — ABNORMAL HIGH (ref 150–379)
RBC: 3.88 x10E6/uL (ref 3.77–5.28)
RDW: 14.6 % (ref 12.3–15.4)
WBC: 3.2 10*3/uL — ABNORMAL LOW (ref 3.4–10.8)

## 2014-08-08 LAB — METABOLIC PANEL, COMPREHENSIVE
A-G Ratio: 1.6 (ref 1.1–2.5)
ALT (SGPT): 36 IU/L — ABNORMAL HIGH (ref 0–32)
AST (SGOT): 33 IU/L (ref 0–40)
Albumin: 4.1 g/dL (ref 3.5–5.5)
Alk. phosphatase: 55 IU/L (ref 39–117)
BUN/Creatinine ratio: 10 (ref 9–23)
BUN: 7 mg/dL (ref 6–24)
Bilirubin, total: <0.2 mg/dL (ref 0.0–1.2)
CO2: 23 mmol/L (ref 18–29)
Calcium: 8.8 mg/dL (ref 8.7–10.2)
Chloride: 102 mmol/L (ref 97–108)
Creatinine: 0.73 mg/dL (ref 0.57–1.00)
GFR est AA: 114 mL/min/{1.73_m2} (ref >59)
GFR est non-AA: 99 mL/min/{1.73_m2} (ref >59)
GLOBULIN, TOTAL: 2.6 g/dL (ref 1.5–4.5)
Glucose: 80 mg/dL (ref 65–99)
Potassium: 4.5 mmol/L (ref 3.5–5.2)
Protein, total: 6.7 g/dL (ref 6.0–8.5)
Sodium: 138 mmol/L (ref 134–144)

## 2014-08-08 LAB — LIPID PANEL
Cholesterol, total: 225 mg/dL — ABNORMAL HIGH (ref 100–199)
HDL Cholesterol: 87 mg/dL (ref >39)
LDL, calculated: 126 mg/dL — ABNORMAL HIGH (ref 0–99)
Triglyceride: 60 mg/dL (ref 0–149)
VLDL, calculated: 12 mg/dL (ref 5–40)

## 2014-08-08 LAB — CVD REPORT

## 2014-08-11 LAB — HIV-1 RNA QT BY PCR

## 2014-11-07 ENCOUNTER — Ambulatory Visit
Admit: 2014-11-07 | Discharge: 2014-11-07 | Payer: PRIVATE HEALTH INSURANCE | Attending: Internal Medicine | Primary: Family Medicine

## 2014-11-07 ENCOUNTER — Inpatient Hospital Stay: Admit: 2014-11-07 | Payer: MEDICAID | Primary: Family Medicine

## 2014-11-07 DIAGNOSIS — B20 Human immunodeficiency virus [HIV] disease: Secondary | ICD-10-CM

## 2014-11-07 NOTE — Progress Notes (Signed)
1. Have you been to the ER, urgent care clinic since your last visit?  Hospitalized since your last visit?No    2. Have you seen or consulted any other health care providers outside of the [facility] since your last visit?  Include any pap smears or colon screening. No  Patient states she is here for follow up

## 2014-11-07 NOTE — Progress Notes (Signed)
Subjective:    Traci [name] is a 49 y.o. female who presents for follow up of HIV.   Patient has been compliant with ART.    Advised pt to take meds for blood pressure.  Advised pt to see PCP    Review of Systems:   Pertinent Positives: Admits to no specific complaints   Pertinent Negatives: Denies no specific complaints   Is the patient sexually active? yes   Patient Active Problem List    Diagnosis Date Noted   ??? 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 Generalized anxiety disorder 08/21/2013   ??? Posttraumatic stress disorder 08/21/2013   ??? 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 Memory loss 05/28/2013   ??? Headache 05/28/2013   ??? HIV (human immunodeficiency virus infection) (HCC) 04/17/2013   ??? Marijuana abuse 04/17/2013   ??? Marijuana intoxication (HCC) 04/17/2013   ??? Mood disorder (HCC) 04/17/2013   ??? HTN (hypertension) 04/17/2013   ??? Tobacco dependence 04/17/2013   ??? TBI (traumatic brain injury) (HCC) 04/17/2013   ??? Psychosis 04/16/2013        Past Medical History   Diagnosis Date   ??? Hypertension    ??? Gastrointestinal disorder      GERD   ??? Other ill-defined conditions(799.89)      HIV   ??? Depression    ??? Psychotic disorder    ??? Substance abuse    ??? Anxiety disorder    ??? GERD (gastroesophageal reflux disease)    ??? Autoimmune disease (HCC)      HIV   ??? Arthritis    ??? Headache    ??? Nervous breakdown 04/16/13   ??? Asthma    ??? HIV (human immunodeficiency virus infection) (HCC)    ??? Fall    ??? Fatigue    ??? Memory change    ??? Schizophrenia (HCC)        No Known Allergies    Current Outpatient Prescriptions   Medication Sig Dispense Refill   ??? ATRIPLA tablet take 1 tablet by mouth NIGHTLY 30 Tab 5   ??? polyethylene glycol (MIRALAX) 17 gram packet Take 1 Packet by mouth daily. 90 Packet 3   ??? PROAIR HFA 90 mcg/actuation inhaler   0   ??? divalproex ER (DEPAKOTE ER) 250 mg ER tablet   0    ??? gabapentin (NEURONTIN) 300 mg capsule   0   ??? naproxen (NAPROSYN) 500 mg tablet   0   ??? traZODone (DESYREL) 100 mg tablet   0   ??? cetirizine (ZYRTEC) 10 mg tablet Take 1 Tab by mouth daily. 20 Tab 0   ??? sodium chloride (OCEAN) 0.65 % nasal spray 1 Spray by Both Nostrils route as needed for Congestion. 15 mL 0   ??? divalproex DR (DEPAKOTE) 500 mg tablet Take 750 mg by mouth two (2) times a day.     ??? amlodipine (NORVASC) 10 mg tablet Take  by mouth daily.     ??? cyclobenzaprine (FLEXERIL) 10 mg tablet Take  by mouth three (3) times daily as needed for Muscle Spasm(s).     ??? hydrochlorothiazide (HYDRODIURIL) 25 mg tablet Take 25 mg by mouth daily.           Family History   Problem Relation Age of Onset   ??? Hypertension Mother    ??? Cancer Mother      thyroid cancer   ??? Heart Disease  Father    ??? Hypertension Father        History     Social History   ??? Marital Status: SINGLE     Spouse Name: N/A   ??? Number of Children: N/A   ??? Years of Education: N/A     Occupational History   ??? Not on file.     Social History Main Topics   ??? Smoking status: Former Smoker -- 30 years   ??? Smokeless tobacco: Current User   ??? Alcohol Use: No   ??? Drug Use: Yes     Special: Marijuana      Comment: last use 12/25/13   ??? Sexual Activity: Not on file     Other Topics Concern   ??? Not on file     Social History Narrative       Objective:    Blood pressure 140/90, pulse 77, temperature 98.2 ??F (36.8 ??C), temperature source Oral, resp. rate 20, height 5\' 6"  (1.676 m), weight 192 lb (87.091 kg), SpO2 99 %.  Physical Exam:   BP 140/90 mmHg   Pulse 77   Temp(Src) 98.2 ??F (36.8 ??C) (Oral)   Resp 20   Ht 5\' 6"  (1.676 m)   Wt 192 lb (87.091 kg)   BMI 31.00 kg/m2   SpO2 99%    General appearance  alert, cooperative, no distress, appears stated age   Head  Normocephalic, without obvious abnormality, atraumatic   Eyes  conjunctivae/corneas clear. PERRL, EOM's intact. Fundi benign   Ears  normal TM's and external ear canals AU    Nose Nares normal. Septum midline. Mucosa normal. No drainage or sinus tenderness.   Throat Lips, mucosa, and tongue normal. Teeth and gums normal   Neck supple, symmetrical, trachea midline, no adenopathy, thyroid: not enlarged, symmetric, no tenderness/mass/nodules, no carotid bruit and no JVD   Back   symmetric, no curvature. ROM normal. No CVA tenderness   Lungs   clear to auscultation bilaterally   Breasts  no masses, tenderness   Heart  regular rate and rhythm, S1, S2 normal, no murmur, click, rub or [name]   Abdomen   soft, non-tender. Bowel sounds normal. No masses,  No organomegaly   Pelvic Deferred   Extremities extremities normal, atraumatic, no cyanosis or edema   Pulses 2+ and symmetric   Skin Skin color, texture, turgor normal. No rashes or lesions   Lymph nodes Cervical, supraclavicular, and axillary nodes normal.   Neurologic Normal            ICD-10-CM ICD-9-CM    1. HIV (human immunodeficiency virus infection) (HCC) Z21 V08 LYMPHOCYTES, CD4 PERCENT AND ABSOLUTE      HIV-1 RNA QT BY PCR      CBC WITH AUTOMATED DIFF      METABOLIC PANEL, COMPREHENSIVE   2. Essential hypertension I10 401.9        I have discussed the diagnosis with the patient and the intended plan as seen in the above orders.     The patient has received an after-visit summary and questions were answered concerning future plans.     I have discussed medication side effects and warnings with the patient as well.    Reviewed test results at length with patient        Drug used discussed yes   Safer sex guidelines discussed yes

## 2014-11-08 NOTE — Patient Instructions (Signed)
MyChart Activation    Thank you for requesting access to MyChart. Please follow the instructions below to securely access and download your online medical record. MyChart allows you to send messages to your doctor, view your test results, renew your prescriptions, schedule appointments, and more.    How Do I Sign Up?    1. In your internet browser, go to [email or web address].  2. Click on the First Time User? Click Here link in the Sign In box. You will see the New Member Sign Up page.  3. Enter your MyChart Access Code exactly as it appears below. You will not need to use this code after you???ve completed the sign-up process. If you do not sign up before the expiration date, you must request a new code.    MyChart Access Code: [email or web address]  Expires: 02/05/2015  9:26 AM (This is the date your MyChart access code will expire)    4. Enter the last four digits of your Social Security Number (xxxx) and Date of Birth (mm/dd/yyyy) as indicated and click Submit. You will be taken to the next sign-up page.  5. Create a MyChart ID. This will be your MyChart login ID and cannot be changed, so think of one that is secure and easy to remember.  6. Create a MyChart password. You can change your password at any time.  7. Enter your Password Reset Question and Answer. This can be used at a later time if you forget your password.   8. Enter your e-mail address. You will receive e-mail notification when new information is available in MyChart.  9. Click Sign Up. You can now view and download portions of your medical record.  10. Click the Download Summary menu link to download a portable copy of your medical information.    Additional Information    If you have questions, please visit the Frequently Asked Questions section of the MyChart website at [email or web address] Remember, MyChart is NOT to be used for urgent needs. For medical emergencies, dial 911.       walk daily

## 2014-11-11 LAB — METABOLIC PANEL, COMPREHENSIVE
A-G Ratio: 1.7 (ref 1.1–2.5)
ALT (SGPT): 78 IU/L — ABNORMAL HIGH (ref 0–32)
AST (SGOT): 62 IU/L — ABNORMAL HIGH (ref 0–40)
Albumin: 4.8 g/dL (ref 3.5–5.5)
Alk. phosphatase: 69 IU/L (ref 39–117)
BUN/Creatinine ratio: 7 — ABNORMAL LOW (ref 9–23)
BUN: 6 mg/dL (ref 6–24)
Bilirubin, total: <0.2 mg/dL (ref 0.0–1.2)
CO2: 23 mmol/L (ref 18–29)
Calcium: 9.5 mg/dL (ref 8.7–10.2)
Chloride: 99 mmol/L (ref 97–108)
Creatinine: 0.82 mg/dL (ref 0.57–1.00)
GFR est AA: 99 mL/min/{1.73_m2} (ref >59)
GFR est non-AA: 86 mL/min/{1.73_m2} (ref >59)
GLOBULIN, TOTAL: 2.9 g/dL (ref 1.5–4.5)
Glucose: 89 mg/dL (ref 65–99)
Potassium: 4.4 mmol/L (ref 3.5–5.2)
Protein, total: 7.7 g/dL (ref 6.0–8.5)
Sodium: 137 mmol/L (ref 134–144)

## 2014-11-11 LAB — CBC WITH AUTOMATED DIFF
ABS. BASOPHILS: 0 10*3/uL (ref 0.0–0.2)
ABS. EOSINOPHILS: 0.1 10*3/uL (ref 0.0–0.4)
ABS. IMM. GRANS.: 0 10*3/uL (ref 0.0–0.1)
ABS. MONOCYTES: 0.4 10*3/uL (ref 0.1–0.9)
ABS. NEUTROPHILS: 1.2 10*3/uL — ABNORMAL LOW (ref 1.4–7.0)
Abs Lymphocytes: 1.5 10*3/uL (ref 0.7–3.1)
BASOPHILS: 0 %
EOSINOPHILS: 4 %
HCT: 39.9 % (ref 34.0–46.6)
HGB: 13.4 g/dL (ref 11.1–15.9)
IMMATURE GRANULOCYTES: 0 %
Lymphocytes: 47 %
MCH: 30.5 pg (ref 26.6–33.0)
MCHC: 33.6 g/dL (ref 31.5–35.7)
MCV: 91 fL (ref 79–97)
MONOCYTES: 11 %
NEUTROPHILS: 38 %
PLATELET: 444 10*3/uL — ABNORMAL HIGH (ref 150–379)
RBC: 4.39 x10E6/uL (ref 3.77–5.28)
RDW: 15.1 % (ref 12.3–15.4)
WBC: 3.2 10*3/uL — ABNORMAL LOW (ref 3.4–10.8)

## 2014-11-11 LAB — HIV-1 RNA QT BY PCR
HIV-1 RNA by PCR: <20 copies/mL
HIV-1 RNA by PCR: <20 copies/mL

## 2014-11-21 ENCOUNTER — Inpatient Hospital Stay: Admit: 2014-11-21 | Payer: MEDICAID | Primary: Family Medicine

## 2014-11-21 ENCOUNTER — Encounter

## 2014-11-21 ENCOUNTER — Ambulatory Visit
Admit: 2014-11-21 | Discharge: 2014-11-21 | Payer: PRIVATE HEALTH INSURANCE | Attending: Internal Medicine | Primary: Family Medicine

## 2014-11-21 DIAGNOSIS — M25562 Pain in left knee: Secondary | ICD-10-CM

## 2014-11-21 DIAGNOSIS — B20 Human immunodeficiency virus [HIV] disease: Secondary | ICD-10-CM

## 2014-11-21 NOTE — Progress Notes (Signed)
Chief Complaint   Patient presents with   ??? HIV     pt here for follow up on her HIV. taking medicine as prescribed.

## 2014-11-21 NOTE — Telephone Encounter (Signed)
Traci [name] ,  Could you please make pt appointment with [facility]( [facility] ) at [address] . Pt says [address]  [address] to see [facility] [place] docs. Appt within next 3 months.  Thank you

## 2014-11-21 NOTE — Progress Notes (Signed)
Subjective:    Traci [name] is a 49 y.o. female who presents for follow up of HIV.   Patient has been compliant with ART. No missed doses , no fever , chills.  Review of Systems:   Pertinent Positives: Admits to no specific complaints   Pertinent Negatives: Denies fevers, chills, night sweats, change in appetite or unexplained weight loss   Is the patient sexually active? yes   Patient Active Problem List   Diagnosis Code   ??? Psychosis F29   ??? HIV (human immunodeficiency virus infection) (HCC) Z21   ??? Marijuana abuse F12.10   ??? Marijuana intoxication (HCC) F12.929   ??? Mood disorder (HCC) F39   ??? HTN (hypertension) I10   ??? Tobacco dependence F17.200   ??? TBI (traumatic brain injury) (HCC) S06.9X9A   ??? Memory loss R41.3   ??? Headache    ??? 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 Generalized anxiety disorder F41.1   ??? Posttraumatic stress disorder F43.10   ??? Somatization disorder F45.0   ??? Attention deficit disorder without mention of hyperactivity F90.0        Past Medical History   Diagnosis Date   ??? Hypertension    ??? Gastrointestinal disorder      GERD   ??? Other ill-defined conditions(799.89)      HIV   ??? Depression    ??? Psychotic disorder    ??? Substance abuse    ??? Anxiety disorder    ??? GERD (gastroesophageal reflux disease)    ??? Autoimmune disease (HCC)      HIV   ??? Arthritis    ??? Headache    ??? Nervous breakdown 04/16/13   ??? Asthma    ??? HIV (human immunodeficiency virus infection) (HCC)    ??? Fall    ??? Fatigue    ??? Memory change    ??? Schizophrenia (HCC)        No Known Allergies    Current Outpatient Prescriptions   Medication Sig Dispense Refill   ??? ATRIPLA tablet take 1 tablet by mouth NIGHTLY 30 Tab 5   ??? polyethylene glycol (MIRALAX) 17 gram packet Take 1 Packet by mouth daily. 90 Packet 3   ??? PROAIR HFA 90 mcg/actuation inhaler   0   ??? divalproex ER (DEPAKOTE ER) 250 mg ER tablet   0   ??? gabapentin (NEURONTIN) 300 mg capsule   0   ??? naproxen (NAPROSYN) 500 mg tablet   0    ??? traZODone (DESYREL) 100 mg tablet   0   ??? cetirizine (ZYRTEC) 10 mg tablet Take 1 Tab by mouth daily. 20 Tab 0   ??? sodium chloride (OCEAN) 0.65 % nasal spray 1 Spray by Both Nostrils route as needed for Congestion. 15 mL 0   ??? divalproex DR (DEPAKOTE) 500 mg tablet Take 750 mg by mouth two (2) times a day.     ??? amlodipine (NORVASC) 10 mg tablet Take  by mouth daily.     ??? cyclobenzaprine (FLEXERIL) 10 mg tablet Take  by mouth three (3) times daily as needed for Muscle Spasm(s).     ??? hydrochlorothiazide (HYDRODIURIL) 25 mg tablet Take 25 mg by mouth daily.           Family History   Problem Relation Age of Onset   ??? Hypertension Mother    ??? Cancer Mother      thyroid cancer   ??? Heart Disease Father    ???  Hypertension Father        History     Social History   ??? Marital Status: SINGLE     Spouse Name: N/A   ??? Number of Children: N/A   ??? Years of Education: N/A     Occupational History   ??? Not on file.     Social History Main Topics   ??? Smoking status: Former Smoker -- 30 years   ??? Smokeless tobacco: Current User   ??? Alcohol Use: No   ??? Drug Use: Yes     Special: Marijuana      Comment: last use 12/25/13   ??? Sexual Activity: Not on file     Other Topics Concern   ??? Not on file     Social History Narrative       Objective:    Blood pressure 132/87, pulse 67, temperature 97 ??F (36.1 ??C), temperature source Oral, resp. rate 20, height  (1.676 m), weight 193 lb (87.544 kg), last menstrual period 11/04/2014, SpO2 100 %.  Physical Exam:   BP 132/87 mmHg   Pulse 67   Temp(Src) 97 ??F (36.1 ??C) (Oral)   Resp 20   Ht  (1.676 m)   Wt 193 lb (87.544 kg)   BMI 31.17 kg/m2   SpO2 100%   LMP 11/04/2014    General appearance  alert, cooperative, no distress, appears stated age   Head  Normocephalic, without obvious abnormality, atraumatic   Eyes  conjunctivae/corneas clear. PERRL, EOM's intact. Fundi benign   Ears  normal TM's and external ear canals AU    Nose Nares normal. Septum midline. Mucosa normal. No drainage or sinus tenderness.   Throat Lips, mucosa, and tongue normal. Teeth and gums normal   Neck supple, symmetrical, trachea midline, no adenopathy, thyroid: not enlarged, symmetric, no tenderness/mass/nodules, no carotid bruit and no JVD   Back   symmetric, no curvature. ROM normal. No CVA tenderness   Lungs   clear to auscultation bilaterally           Abdomen   soft, non-tender. Bowel sounds normal. No masses,  No organomegaly   Pelvic Deferred   Extremities extremities normal, atraumatic, no cyanosis or edema   Pulses 2+ and symmetric   Skin Skin color, texture, turgor normal. No rashes or lesions   Lymph nodes Cervical, supraclavicular, and axillary nodes normal.   Neurologic Normal          Assessment/ Plan:        ICD-10-CM ICD-9-CM    1. HIV (human immunodeficiency virus infection) (HCC) Z21 V08 CBC WITH AUTOMATED DIFF      LYMPHOCYTES, CD4 PERCENT AND ABSOLUTE       I have discussed the diagnosis with the patient and the intended plan as seen in the above orders.     The patient has received an after-visit summary and questions were answered concerning future plans.     I have discussed medication side effects and warnings with the patient as well.    Reviewed test results at length with patient         Drug used discussed yes   Safer sex guidelines discussed yes

## 2014-11-22 LAB — LYMPHOCYTES, CD4 PERCENT AND ABSOLUTE
% CD 4 Pos Lymph: 42.6 % (ref 30.8–58.5)
ABS. BASOPHILS: 0 10*3/uL (ref 0.0–0.2)
ABS. EOSINOPHILS: 0.1 10*3/uL (ref 0.0–0.4)
ABS. IMM. GRANS.: 0 10*3/uL (ref 0.0–0.1)
ABS. MONOCYTES: 0.5 10*3/uL (ref 0.1–0.9)
ABS. NEUTROPHILS: 1 10*3/uL — ABNORMAL LOW (ref 1.4–7.0)
Abs CD4 Helper: 682 /uL (ref 359–1519)
Abs Lymphocytes: 1.6 10*3/uL (ref 0.7–3.1)
BASOPHILS: 1 %
EOSINOPHILS: 4 %
HCT: 36.7 % (ref 34.0–46.6)
HGB: 12.3 g/dL (ref 11.1–15.9)
IMMATURE GRANULOCYTES: 0 %
Lymphocytes: 50 %
MCH: 30.2 pg (ref 26.6–33.0)
MCHC: 33.5 g/dL (ref 31.5–35.7)
MCV: 90 fL (ref 79–97)
MONOCYTES: 14 %
NEUTROPHILS: 31 %
PLATELET: 354 10*3/uL (ref 150–379)
RBC: 4.07 x10E6/uL (ref 3.77–5.28)
RDW: 14.3 % (ref 12.3–15.4)
WBC: 3.2 10*3/uL — ABNORMAL LOW (ref 3.4–10.8)

## 2015-02-26 ENCOUNTER — Inpatient Hospital Stay
Admit: 2015-02-26 | Discharge: 2015-02-26 | Disposition: A | Discharge status: Home / Self Care | Payer: MEDICAID | Attending: Emergency Medicine

## 2015-02-26 DIAGNOSIS — M25561 Pain in right knee: Secondary | ICD-10-CM

## 2015-02-26 MED ORDER — NAPROXEN 500 MG TAB
500 mg | ORAL_TABLET | Freq: Two times a day (BID) | ORAL | Status: DC | PRN
Start: 2015-02-26 — End: 2015-10-31

## 2015-02-26 MED ORDER — KETOROLAC TROMETHAMINE 30 MG/ML INJECTION
30 mg/mL (1 mL) | INTRAMUSCULAR | Status: AC
Start: 2015-02-26 — End: 2015-02-26
  Administered 2015-02-26: 22:00:00 via INTRAMUSCULAR

## 2015-02-26 MED FILL — KETOROLAC TROMETHAMINE 30 MG/ML INJECTION: 30 mg/mL (1 mL) | INTRAMUSCULAR | Qty: 1

## 2015-02-26 NOTE — ED Notes (Signed)
Pt did not take all of her blood pressure medications today

## 2015-02-26 NOTE — ED Provider Notes (Signed)
Patient is a 49 y.o. female presenting with knee pain. The history is provided by the patient.   Knee Pain   This is a new problem. Episode onset: 3 days. The problem occurs constantly. The problem has not changed since onset.The pain is present in the right knee. The quality of the pain is described as constant. The pain is at a severity of 10/10. The pain is severe. Associated symptoms include stiffness. Pertinent negatives include no numbness. The symptoms are aggravated by activity. She has tried nothing for the symptoms. There has been no history of extremity trauma.        Past Medical History:   Diagnosis Date   ??? Hypertension    ??? Gastrointestinal disorder      GERD   ??? Other ill-defined conditions(799.89)      HIV   ??? Depression    ??? Psychotic disorder    ??? Substance abuse    ??? Anxiety disorder    ??? GERD (gastroesophageal reflux disease)    ??? Autoimmune disease (HCC)      HIV   ??? Arthritis    ??? Headache    ??? Nervous breakdown 04/16/13   ??? Asthma    ??? HIV (human immunodeficiency virus infection) (HCC)    ??? Fall    ??? Fatigue    ??? Memory change    ??? Schizophrenia [facility])        Past Surgical History:   Procedure Laterality Date   ??? Hx gyn       Tubal ligation   ??? Hx orthopaedic       Left knee         Family History:   Problem Relation Age of Onset   ??? Hypertension Mother    ??? Cancer Mother      thyroid cancer   ??? Heart Disease Father    ??? Hypertension Father        History     Social History   ??? Marital Status: SINGLE     Spouse Name: N/A   ??? Number of Children: N/A   ??? Years of Education: N/A     Occupational History   ??? Not on file.     Social History Main Topics   ??? Smoking status: Former Smoker -- 30 years   ??? Smokeless tobacco: Current User   ??? Alcohol Use: No   ??? Drug Use: Yes     Special: Marijuana      Comment: last use 12/25/13   ??? Sexual Activity: Not on file     Other Topics Concern   ??? Not on file     Social History Narrative          ALLERGIES: Review of patient's allergies indicates no known allergies.    Review of Systems   Constitutional: Negative for fever.   Musculoskeletal: Positive for stiffness. Negative for joint swelling and arthralgias.   Neurological: Negative for numbness.       Filed Vitals:    02/26/15 1631 02/26/15 1742 02/26/15 1806   BP: 156/104 175/115 155/82   Pulse: 92  61   Temp: 98.6 ??F (37 ??C)     Resp: 18  18   Height: 5\' 5"  (1.651 m)     Weight: 75.751 kg (167 lb)     SpO2: 98%  100%            Physical Exam   Constitutional: She is oriented to person, place, and time. She  appears well-developed and well-nourished. No distress.   HENT:   Head: Normocephalic and atraumatic.   Eyes: Conjunctivae are normal.   Cardiovascular: Normal rate, regular rhythm and normal heart sounds.    Pulmonary/Chest: Effort normal and breath sounds normal. No respiratory distress. She has no wheezes. She has no rales.   Musculoskeletal:        Right knee: She exhibits decreased range of motion and bony tenderness. She exhibits no swelling, no effusion, no ecchymosis, no deformity, no laceration and no erythema. Tenderness found.   Neurological: She is alert and oriented to person, place, and time.   Skin: Skin is warm and dry.   Vitals reviewed.       MDM  Number of Diagnoses or Management Options  Diagnosis management comments: DDX: arthralgia, tendonitis, strain, sprain, fracture       Amount and/or Complexity of Data Reviewed  Tests in the radiology section of CPT??: ordered and reviewed        Procedures

## 2015-02-26 NOTE — ED Notes (Signed)
Pt reports swelling and pain in right knee, reports "cramping" in lower right leg, denies any injury, reports hx of arthritis in knee, this episode began about 3 days ago      Emergency Department Nursing Plan of Care       The Nursing Plan of Care is developed from the Nursing assessment and Emergency Department Attending provider initial evaluation.  The plan of care may be reviewed in the ???ED Provider note???.    The Plan of Care was developed with the following considerations:   Patient / Family readiness to learn indicated [id number] understanding  Persons(s) to be included in education: patient  Barriers to Learning/Limitations:No    Signed     Deretha Emory, RN    02/26/2015   5:33 PM

## 2015-02-26 NOTE — ED Notes (Signed)
Patient (s)  given copy of dc instructions.  Patient (s)  verbalized understanding of instructions and script (s).  Patient given a current medication reconciliation form and verbalized understanding of their medications.   Patient (s) verbalized understanding of the importance of discussing medications with  his or her physician or clinic they will be following up with.  Patient alert and oriented and in no acute distress.  Patient discharged home ambulatory with ace wrap to right knee.

## 2015-10-31 ENCOUNTER — Inpatient Hospital Stay
Admit: 2015-10-31 | Discharge: 2015-10-31 | Disposition: A | Discharge status: Home / Self Care | Payer: MEDICAID | Attending: Emergency Medicine

## 2015-10-31 DIAGNOSIS — S46912A Strain of unspecified muscle, fascia and tendon at shoulder and upper arm level, left arm, initial encounter: Secondary | ICD-10-CM

## 2015-10-31 MED ORDER — METHOCARBAMOL 500 MG TAB
500 mg | ORAL_TABLET | Freq: Four times a day (QID) | ORAL | 0 refills | Status: AC | PRN
Start: 2015-10-31 — End: ?

## 2015-10-31 MED ORDER — NAPROXEN 500 MG TAB
500 mg | ORAL_TABLET | Freq: Two times a day (BID) | ORAL | 0 refills | Status: DC | PRN
Start: 2015-10-31 — End: 2018-04-21

## 2015-10-31 MED ORDER — KETOROLAC TROMETHAMINE 30 MG/ML INJECTION
30 mg/mL (1 mL) | INTRAMUSCULAR | Status: AC
Start: 2015-10-31 — End: 2015-10-31
  Administered 2015-10-31: 18:00:00 via INTRAMUSCULAR

## 2015-10-31 MED FILL — KETOROLAC TROMETHAMINE 30 MG/ML INJECTION: 30 mg/mL (1 mL) | INTRAMUSCULAR | Qty: 1

## 2015-10-31 NOTE — ED Notes (Signed)
Patient (s)  given copy of dc instructions and 2 e-script(s).  Patient(s)  verbalized understanding of instructions and script (s).  Patient given a current medication reconciliation form and verbalized understanding of their medications.   Patient (s) verbalized understanding of the importance of discussing medications with  his or her physician or clinic when they follow up.  Patient alert and oriented and in no acute distress.  Pt verbalizes pain scale of 9 out of 10.  Patient discharged home ambulatory with self.

## 2015-10-31 NOTE — ED Provider Notes (Signed)
Patient is a 50 y.o. female presenting with shoulder pain. The history is provided by the patient.   Shoulder Pain    Incident onset: 4 days ago pt states she moved her [place] cabinet and since then has been having L. shoulder and neck pain. The incident occurred at home. The right shoulder is affected. The pain is at a severity of 9/10. The pain is moderate. The pain has been constant since onset. There is no history of shoulder injury. There is no history of shoulder surgery. Pertinent negatives include no numbness and no tingling. She reports no foreign bodies present.        Past Medical History:   Diagnosis Date   ??? Anxiety disorder    ??? Arthritis    ??? Asthma    ??? Autoimmune disease (HCC)      HIV   ??? Depression    ??? Fall    ??? Fatigue    ??? Gastrointestinal disorder      GERD   ??? GERD (gastroesophageal reflux disease)    ??? Headache    ??? HIV (human immunodeficiency virus infection) (HCC)    ??? Hypertension    ??? Memory change    ??? Nervous breakdown 04/16/13   ??? Other ill-defined conditions(799.89)      HIV   ??? Psychotic disorder    ??? Schizophrenia (HCC)    ??? Substance abuse        Past Surgical History:   Procedure Laterality Date   ??? Hx gyn       Tubal ligation   ??? Hx orthopaedic       Left knee         Family History:   Problem Relation Age of Onset   ??? Hypertension Mother    ??? Cancer Mother      thyroid cancer   ??? Heart Disease Father    ??? Hypertension Father        Social History     Social History   ??? Marital status: SINGLE     Spouse name: N/A   ??? Number of children: N/A   ??? Years of education: N/A     Occupational History   ??? Not on file.     Social History Main Topics   ??? Smoking status: Former Smoker     Years: 30.00   ??? Smokeless tobacco: Current User   ??? Alcohol use No   ??? Drug use: Yes     Special: Marijuana      Comment: last use 12/25/13   ??? Sexual activity: Not on file     Other Topics Concern   ??? Not on file     Social History Narrative          ALLERGIES: Review of patient's allergies indicates no known allergies.    Review of Systems   Constitutional: Negative for fever.   HENT: Positive for rhinorrhea.    Musculoskeletal: Positive for arthralgias and myalgias.   Skin: Negative for wound.   Allergic/Immunologic: Positive for immunocompromised state.   Neurological: Negative for tingling and numbness.   All other systems reviewed and are negative.      Vitals:    10/31/15 1213   BP: 127/70   Pulse: 79   Resp: 18   Temp: 98.6 ??F (37 ??C)   SpO2: 99%   Weight: 97.5 kg (215 lb)   Height:  (1.727 m)  Physical Exam   Constitutional: She is oriented to person, place, and time. She appears well-developed and well-nourished. No distress.   HENT:   Head: Normocephalic and atraumatic.   Eyes: Conjunctivae are normal.   Cardiovascular: Normal rate, regular rhythm and normal heart sounds.    Pulmonary/Chest: Effort normal and breath sounds normal. No respiratory distress. She has no wheezes. She has no rales.   Musculoskeletal:        Left shoulder: She exhibits decreased range of motion (pain with limited ROM), tenderness (along L. trapezius mm), bony tenderness and pain. She exhibits no swelling, no effusion, no crepitus, no deformity, no laceration and normal pulse.   Neurological: She is alert and oriented to person, place, and time.   Skin: Skin is warm and dry.   Psychiatric: She has a normal mood and affect. Her behavior is normal. Judgment and thought content normal.   Nursing note and vitals reviewed.       MDM  Number of Diagnoses or Management Options  Diagnosis management comments: DDX: shoulder strain, sprain, mm spasm    ED Course       Procedures

## 2015-10-31 NOTE — ED Notes (Signed)
Left shoulder and neck pain related to moving piece of furniture recently.

## 2016-02-11 ENCOUNTER — Inpatient Hospital Stay
Admit: 2016-02-11 | Discharge: 2016-02-11 | Disposition: A | Discharge status: Home / Self Care | Payer: MEDICAID | Attending: Emergency Medicine

## 2016-02-11 DIAGNOSIS — J209 Acute bronchitis, unspecified: Secondary | ICD-10-CM

## 2016-02-11 MED ORDER — PREDNISONE 50 MG TAB
50 mg | ORAL_TABLET | Freq: Every day | ORAL | 0 refills | Status: AC
Start: 2016-02-11 — End: 2016-02-14

## 2016-02-11 NOTE — ED Notes (Signed)
Pt arrives in the ED with complaints of productive cough, sore throat, shortness of breath, and cold symptoms x 1 week.  Pt reports chills but no known fevers.  Pt reports cleaning out a bathroom with 'black mold' about a week ago and did not wear a mask.

## 2016-02-11 NOTE — ED Notes (Signed)
Emergency Department Nursing Plan of Care       The Nursing Plan of Care is developed from the Nursing assessment and Emergency Department Attending provider initial evaluation.  The plan of care may be reviewed in the ???ED Provider note???.    The Plan of Care was developed with the following considerations:   Patient / Family readiness to learn indicated [id number]:verbalized understanding  Persons(s) to be included in education: patient  Barriers to Learning/Limitations:No    Signed     Rolena InfanteKatherine L Guilford    02/11/2016   1:24 PM

## 2016-02-11 NOTE — ED Notes (Signed)
Patient given printed discharge instructions and one script(s).  Pt verbalized understanding of instructions and script(s).  Patient verbalized importance of following up with one.  Patient alert and oriented, in no acute distress, ambulatory with self.

## 2016-02-11 NOTE — ED Provider Notes (Signed)
HPI Comments: Traci [name] is a 50 y.o. female with PMHx of HTN / COPD / arthritis / GERD / HIV / schizophrenia who presents ambulatory to the ED c/o acute onset of sore throat x 3 days. She complains of associated cough and SOB. Pt notes that she is prescribed Proair and typically doses BID but has increased her usage to 3 or 4 times daily since she began experiencing her current symptoms. She reports known contact with "black mold" x 1 week in a house that she has been helping a friend clean out. Pt denies using a mask. Pt specifically denies fever, chills, nausea, vomiting, or CP.     Social hx: + Tobacco use (chewing), - EtOH use, - Illicit drug use    PCP: Tommi Rumps, MD  Ortho: [facility]    There are no other complaints, changes or physical findings at this time.      The history is provided by the patient. No language interpreter was used.        Past Medical History:   Diagnosis Date   ??? Anxiety disorder    ??? Arthritis    ??? Asthma    ??? Autoimmune disease (HCC)     HIV   ??? Depression    ??? Fall    ??? Fatigue    ??? Gastrointestinal disorder     GERD   ??? GERD (gastroesophageal reflux disease)    ??? Headache    ??? HIV (human immunodeficiency virus infection) (HCC)    ??? Hypertension    ??? Memory change    ??? Nervous breakdown 04/16/13   ??? Other ill-defined conditions     HIV   ??? Psychotic disorder    ??? Schizophrenia (HCC)    ??? Substance abuse        Past Surgical History:   Procedure Laterality Date   ??? HX GYN      Tubal ligation   ??? HX ORTHOPAEDIC      Left knee         Family History:   Problem Relation Age of Onset   ??? Hypertension Mother    ??? Cancer Mother      thyroid cancer   ??? Heart Disease Father    ??? Hypertension Father        Social History     Social History   ??? Marital status: SINGLE     Spouse name: N/A   ??? Number of children: N/A   ??? Years of education: N/A     Occupational History   ??? Not on file.     Social History Main Topics   ??? Smoking status: Former Smoker     Years: 30.00    ??? Smokeless tobacco: Current User   ??? Alcohol use No   ??? Drug use: Yes     Special: Marijuana      Comment: last use 12/25/13   ??? Sexual activity: Not on file     Other Topics Concern   ??? Not on file     Social History Narrative         ALLERGIES: Review of patient's allergies indicates no known allergies.    Review of Systems   Constitutional: Negative for chills and fever.   HENT: Positive for sore throat. Negative for congestion, rhinorrhea and sneezing.    Eyes: Negative for redness and visual disturbance.   Respiratory: Positive for cough and shortness of breath.    Cardiovascular: Negative for leg swelling.  Gastrointestinal: Negative for abdominal pain, nausea and vomiting.   Genitourinary: Negative for difficulty urinating and frequency.   Musculoskeletal: Negative for back pain, myalgias and neck stiffness.   Skin: Negative for rash.   Neurological: Negative for dizziness, syncope, weakness and headaches.   Hematological: Negative for adenopathy.     Patient Vitals for the past 12 hrs:   Temp Pulse Resp BP SpO2   02/11/16 1321 - - - 131/76 -   02/11/16 1253 98.1 ??F (36.7 ??C) 91 18 (!) 187/98 98 %          Physical Exam   Constitutional: She is oriented to person, place, and time. She appears well-developed and well-nourished.   Slowed speech.   HENT:   Head: Normocephalic and atraumatic.   Mouth/Throat: Oropharynx is clear and moist.   Eyes: Conjunctivae and EOM are normal.   Neck: Normal range of motion and full passive range of motion without pain. Neck supple.   Cardiovascular: Normal rate, regular rhythm, S1 normal, S2 normal, normal heart sounds, intact distal pulses and normal pulses.    No murmur heard.  Pulmonary/Chest: Effort normal and breath sounds normal. No respiratory distress. She has no wheezes.   Lungs CTA.   Abdominal: Soft. Normal appearance and bowel sounds are normal. She exhibits no distension. There is no tenderness. There is no rebound.   Musculoskeletal: Normal range of motion.    Neurological: She is alert and oriented to person, place, and time. She has normal strength.   Skin: Skin is warm, dry and intact. No rash noted.   Psychiatric: She has a normal mood and affect. Her speech is normal and behavior is normal. Judgment and thought content normal.   Nursing note and vitals reviewed.       MDM  Number of Diagnoses or Management Options  Acute bronchitis, unspecified organism:   Diagnosis management comments: DDx: bronchitis, URI, reactive airway disease        Amount and/or Complexity of Data Reviewed  Review and summarize past medical records: yes    Patient Progress  Patient progress: stable    ED Course       Procedures    Progress Note:  1:27 PM  Discussed the risks of chewing tobacco use and the benefits of tobacco cessation as well as the long term sequelae of chewing tobacco with the pt. The patient verbalized their understanding.   Written by Tempie DonningJames B Spriggs, ED Scribe, as dictated by Costella HatcherMichael W. Athina Fahey, MD.    MEDICATIONS GIVEN:  Medications - No data to display    IMPRESSION:  1. Acute bronchitis, unspecified organism        PLAN:  1.   Current Discharge Medication List      START taking these medications    Details   predniSONE (DELTASONE) 50 mg tablet Take 1 Tab by mouth daily for 3 days.  Qty: 3 Tab, Refills: 0           2.   Follow-up Information     Follow up With Details Comments Contact Info    Tommi Rumpsasha Dickerson, MD Schedule an appointment as soon as possible for a visit  [address]  [place] [id number]  [phone]      [facility] EMERG[facility] DE[facility]  As needed, If symptoms worsen [address]  [place] [id number]  [phone]        Return to ED if worse     DISCHARGE NOTE  1:45 PM  The  patient has been re-evaluated and is ready for discharge. Reviewed available results with patient. Counseled pt on diagnosis and care plan. Pt has expressed understanding, and all questions have been answered. Pt agrees with plan and agrees to F/U as recommended, or return to the ED if  their sxs worsen. Discharge instructions have been provided and explained to the pt, along with reasons to return to the ED.    This note is prepared by Scarlette Slice, acting as Scribe for Costella Hatcher, MD.    Costella Hatcher, MD: The scribe's documentation has been prepared under my direction and personally reviewed by me in its entirety. I confirm that the note above accurately reflects all work, treatment, procedures, and medical decision making performed by me.

## 2016-10-17 ENCOUNTER — Ambulatory Visit
Admit: 2016-10-17 | Discharge: 2016-10-17 | Payer: PRIVATE HEALTH INSURANCE | Attending: Psychiatric/Mental Health | Primary: Family Medicine

## 2016-10-17 DIAGNOSIS — F3189 Other bipolar disorder: Secondary | ICD-10-CM
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
## 2016-12-12 ENCOUNTER — Ambulatory Visit
Admit: 2016-12-12 | Discharge: 2016-12-12 | Payer: PRIVATE HEALTH INSURANCE | Attending: Mental Health | Primary: Family Medicine

## 2016-12-12 DIAGNOSIS — F3189 Other bipolar disorder: Secondary | ICD-10-CM
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
## 2016-12-15 ENCOUNTER — Ambulatory Visit
Admit: 2016-12-15 | Discharge: 2016-12-15 | Payer: PRIVATE HEALTH INSURANCE | Attending: Psychiatric/Mental Health | Primary: Family Medicine

## 2016-12-15 DIAGNOSIS — F3189 Other bipolar disorder: Secondary | ICD-10-CM

## 2016-12-15 MED ORDER — HYDROXYZINE PAMOATE 25 MG CAP
25 mg | ORAL_CAPSULE | Freq: Three times a day (TID) | ORAL | 2 refills | Status: DC | PRN
Start: 2016-12-15 — End: 2017-03-20

## 2016-12-15 MED ORDER — OXCARBAZEPINE 300 MG TAB
300 mg | ORAL_TABLET | ORAL | 2 refills | Status: DC
Start: 2016-12-15 — End: 2017-03-20
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
## 2016-12-16 LAB — METABOLIC PANEL, COMPREHENSIVE
A-G Ratio: 1.1 — ABNORMAL LOW (ref 1.2–2.2)
ALT (SGPT): 19 IU/L (ref 0–32)
AST (SGOT): 24 IU/L (ref 0–40)
Albumin: 4.1 g/dL (ref 3.5–5.5)
Alk. phosphatase: 52 IU/L (ref 39–117)
BUN/Creatinine ratio: 8 — ABNORMAL LOW (ref 9–23)
BUN: 8 mg/dL (ref 6–24)
Bilirubin, total: 0.3 mg/dL (ref 0.0–1.2)
CO2: 26 mmol/L (ref 18–29)
Calcium: 9.4 mg/dL (ref 8.7–10.2)
Chloride: 99 mmol/L (ref 96–106)
Creatinine: 0.96 mg/dL (ref 0.57–1.00)
GFR est AA: 81 mL/min/{1.73_m2} (ref >59)
GFR est non-AA: 70 mL/min/{1.73_m2} (ref >59)
GLOBULIN, TOTAL: 3.8 g/dL (ref 1.5–4.5)
Glucose: 87 mg/dL (ref 65–99)
Potassium: 3.8 mmol/L (ref 3.5–5.2)
Protein, total: 7.9 g/dL (ref 6.0–8.5)
Sodium: 139 mmol/L (ref 134–144)

## 2016-12-16 LAB — TSH REFLEX TO T4: TSH: 0.507 u[IU]/mL (ref 0.450–4.500)

## 2016-12-26 ENCOUNTER — Encounter: Attending: Mental Health | Primary: Family Medicine

## 2016-12-29 ENCOUNTER — Ambulatory Visit
Admit: 2016-12-29 | Discharge: 2016-12-29 | Payer: PRIVATE HEALTH INSURANCE | Attending: Mental Health | Primary: Family Medicine

## 2016-12-29 ENCOUNTER — Encounter: Attending: Mental Health | Primary: Family Medicine

## 2016-12-29 DIAGNOSIS — F3189 Other bipolar disorder: Secondary | ICD-10-CM
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
## 2017-02-14 ENCOUNTER — Encounter: Attending: Psychiatric/Mental Health | Primary: Family Medicine

## 2017-03-20 MED ORDER — HYDROXYZINE PAMOATE 25 MG CAP
25 mg | ORAL_CAPSULE | ORAL | 0 refills | Status: DC
Start: 2017-03-20 — End: 2017-05-11

## 2017-03-20 MED ORDER — OXCARBAZEPINE 300 MG TAB
300 mg | ORAL_TABLET | ORAL | 0 refills | Status: DC
Start: 2017-03-20 — End: 2017-05-11

## 2017-03-27 ENCOUNTER — Encounter: Attending: Psychiatric/Mental Health | Primary: Family Medicine

## 2017-05-11 ENCOUNTER — Ambulatory Visit
Admit: 2017-05-11 | Discharge: 2017-05-11 | Payer: PRIVATE HEALTH INSURANCE | Attending: Psychiatric/Mental Health | Primary: Family Medicine

## 2017-05-11 DIAGNOSIS — F3189 Other bipolar disorder: Secondary | ICD-10-CM

## 2017-05-11 MED ORDER — HYDROXYZINE PAMOATE 25 MG CAP
25 mg | ORAL_CAPSULE | ORAL | 2 refills | Status: DC
Start: 2017-05-11 — End: 2017-12-11

## 2017-05-11 MED ORDER — OXCARBAZEPINE 300 MG TAB
300 mg | ORAL_TABLET | ORAL | 2 refills | Status: DC
Start: 2017-05-11 — End: 2017-08-04
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
## 2017-08-04 MED ORDER — OXCARBAZEPINE 300 MG TAB
300 mg | ORAL_TABLET | ORAL | 0 refills | Status: DC
Start: 2017-08-04 — End: 2017-08-06

## 2017-08-07 MED ORDER — OXCARBAZEPINE 300 MG TAB
300 mg | ORAL_TABLET | ORAL | 0 refills | Status: DC
Start: 2017-08-07 — End: 2017-12-11

## 2017-08-15 ENCOUNTER — Encounter: Attending: Psychiatric/Mental Health | Primary: Family Medicine

## 2017-10-05 ENCOUNTER — Encounter: Attending: Psychiatric/Mental Health | Primary: Family Medicine

## 2017-11-30 ENCOUNTER — Encounter

## 2017-12-11 ENCOUNTER — Ambulatory Visit
Admit: 2017-12-11 | Discharge: 2017-12-11 | Payer: PRIVATE HEALTH INSURANCE | Attending: Psychiatric/Mental Health | Primary: Family Medicine

## 2017-12-11 DIAGNOSIS — F332 Major depressive disorder, recurrent severe without psychotic features: Secondary | ICD-10-CM

## 2017-12-11 MED ORDER — TRAZODONE 50 MG TAB
50 mg | ORAL_TABLET | Freq: Every evening | ORAL | 1 refills | Status: DC | PRN
Start: 2017-12-11 — End: 2018-02-02

## 2017-12-11 MED ORDER — SERTRALINE 100 MG TAB
100 mg | ORAL_TABLET | ORAL | 1 refills | Status: DC
Start: 2017-12-11 — End: 2018-02-02
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
## 2017-12-11 NOTE — Patient Instructions (Signed)
Sleep Tips    What to avoid ??  ?? Do not have drinks with caffeine, such as coffee or black tea, for 8 hours before bed.  ?? Do not smoke or use other types of tobacco near bedtime. Nicotine is a stimulant and can keep you awake.  ?? Avoid drinking alcohol late in the evening, because it can cause you to wake in the middle of the night.  ?? Do not eat a big meal close to bedtime. If you are hungry, eat a light snack.  ?? Do not drink a lot of water close to bedtime, because the need to urinate may wake you up during the night.  ?? Do not read or watch TV in bed. Use the bed only for sleeping and sexual activity.  What to try ??  ?? Go to bed at the same time every night, and wake up at the same time every morning. Do not take naps during the day.  ?? Keep your bedroom quiet, dark, and cool.  ?? Get regular exercise, but not within 3 to 4 hours of your bedtime.  ?? Sleep on a comfortable pillow and mattress.  ?? If watching the clock makes you anxious, turn it facing away from you so you cannot see the time.  ?? If you worry when you lie down, start a worry book. Well before bedtime, write down your worries, and then set the book and your concerns aside.  ?? Try meditation or other relaxation techniques before you go to bed.  ?? If you cannot fall asleep, get up and go to another room until you feel sleepy. Do something relaxing. Repeat your bedtime routine before you go to bed again.  ?? Make your house quiet and calm about an hour before bedtime. Turn down the lights, turn off the TV, log off the computer, and turn down the volume on music. This can help you relax after a busy day.

## 2017-12-19 ENCOUNTER — Ambulatory Visit: Payer: MEDICAID | Primary: Family Medicine

## 2017-12-25 ENCOUNTER — Ambulatory Visit: Payer: MEDICAID | Primary: Family Medicine

## 2018-02-02 ENCOUNTER — Encounter

## 2018-02-02 MED ORDER — SERTRALINE 100 MG TAB
100 mg | ORAL_TABLET | Freq: Every day | ORAL | 0 refills | Status: DC
Start: 2018-02-02 — End: 2018-02-08

## 2018-02-02 MED ORDER — TRAZODONE 50 MG TAB
50 mg | ORAL_TABLET | ORAL | 0 refills | Status: DC
Start: 2018-02-02 — End: 2018-02-08

## 2018-02-08 ENCOUNTER — Ambulatory Visit
Admit: 2018-02-08 | Discharge: 2018-02-08 | Payer: PRIVATE HEALTH INSURANCE | Attending: Psychiatric/Mental Health | Primary: Family Medicine

## 2018-02-08 ENCOUNTER — Ambulatory Visit: Attending: Psychiatric/Mental Health | Primary: Family Medicine

## 2018-02-08 DIAGNOSIS — F333 Major depressive disorder, recurrent, severe with psychotic symptoms: Secondary | ICD-10-CM

## 2018-02-08 MED ORDER — SERTRALINE 100 MG TAB
100 mg | ORAL_TABLET | Freq: Every day | ORAL | 2 refills | Status: DC
Start: 2018-02-08 — End: 2018-05-09

## 2018-02-08 MED ORDER — TRAZODONE 50 MG TAB
50 mg | ORAL_TABLET | ORAL | 2 refills | Status: DC
Start: 2018-02-08 — End: 2018-05-09

## 2018-02-08 MED ORDER — RISPERIDONE 0.25 MG TAB
0.25 mg | ORAL_TABLET | Freq: Every evening | ORAL | 2 refills | Status: DC
Start: 2018-02-08 — End: 2018-05-09
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
## 2018-02-16 LAB — CBC WITH AUTOMATED DIFF
ABS. BASOPHILS: 0 10*3/uL (ref 0.0–0.2)
ABS. EOSINOPHILS: 0.1 10*3/uL (ref 0.0–0.4)
ABS. IMM. GRANS.: 0 10*3/uL (ref 0.0–0.1)
ABS. MONOCYTES: 0.3 10*3/uL (ref 0.1–0.9)
ABS. NEUTROPHILS: 2.2 10*3/uL (ref 1.4–7.0)
Abs Lymphocytes: 2.4 10*3/uL (ref 0.7–3.1)
BASOPHILS: 0 %
EOSINOPHILS: 2 %
HCT: 38.9 % (ref 34.0–46.6)
HGB: 13 g/dL (ref 11.1–15.9)
IMMATURE GRANULOCYTES: 0 %
Lymphocytes: 47 %
MCH: 30.4 pg (ref 26.6–33.0)
MCHC: 33.4 g/dL (ref 31.5–35.7)
MCV: 91 fL (ref 79–97)
MONOCYTES: 7 %
NEUTROPHILS: 44 %
PLATELET: 447 10*3/uL (ref 150–450)
RBC: 4.28 x10E6/uL (ref 3.77–5.28)
RDW: 15.3 % (ref 12.3–15.4)
WBC: 5.1 10*3/uL (ref 3.4–10.8)

## 2018-02-16 LAB — METABOLIC PANEL, COMPREHENSIVE
A-G Ratio: 1.5 (ref 1.2–2.2)
ALT (SGPT): 18 IU/L (ref 0–32)
AST (SGOT): 31 IU/L (ref 0–40)
Albumin: 4.6 g/dL (ref 3.5–5.5)
Alk. phosphatase: 62 IU/L (ref 39–117)
BUN/Creatinine ratio: 14 (ref 9–23)
BUN: 10 mg/dL (ref 6–24)
Bilirubin, total: 0.2 mg/dL (ref 0.0–1.2)
CO2: 24 mmol/L (ref 20–29)
Calcium: 9.4 mg/dL (ref 8.7–10.2)
Chloride: 96 mmol/L (ref 96–106)
Creatinine: 0.74 mg/dL (ref 0.57–1.00)
GFR est AA: 110 mL/min/{1.73_m2} (ref >59)
GFR est non-AA: 95 mL/min/{1.73_m2} (ref >59)
GLOBULIN, TOTAL: 3.1 g/dL (ref 1.5–4.5)
Glucose: 97 mg/dL (ref 65–99)
Potassium: 3.1 mmol/L — ABNORMAL LOW (ref 3.5–5.2)
Protein, total: 7.7 g/dL (ref 6.0–8.5)
Sodium: 136 mmol/L (ref 134–144)

## 2018-02-16 LAB — 11-DRUG SCREEN, URINE W RFLX CONFIRM
Amphetamines, urine: NEGATIVE ng/mL
Barbiturates: NEGATIVE ng/mL
Benzodiazepines: NEGATIVE ng/mL
Cannabinoids: POSITIVE — AB
Cocaine: NEGATIVE ng/mL
MDMA: NEGATIVE ng/mL
Methadone Screen, Urine: NEGATIVE ng/mL
Methaqualone: NEGATIVE ng/mL
Opiates: NEGATIVE ng/mL
PROPOXYPHENE, URINE: NEGATIVE ng/mL
Phencyclidine: NEGATIVE ng/mL

## 2018-03-12 ENCOUNTER — Ambulatory Visit: Payer: MEDICAID | Primary: Family Medicine

## 2018-04-21 ENCOUNTER — Inpatient Hospital Stay
Admit: 2018-04-21 | Discharge: 2018-04-21 | Disposition: A | Discharge status: Home / Self Care | Payer: MEDICAID | Attending: Emergency Medicine

## 2018-04-21 DIAGNOSIS — S0081XA Abrasion of other part of head, initial encounter: Secondary | ICD-10-CM

## 2018-04-21 MED ORDER — DICLOFENAC 75 MG TAB, DELAYED RELEASE
75 mg | ORAL_TABLET | Freq: Two times a day (BID) | ORAL | 0 refills | Status: AC
Start: 2018-04-21 — End: ?

## 2018-04-21 MED ORDER — DIPHTH,PERTUS(AC)TETANUS VAC(PF) 2.5 LF UNIT-8 MCG-5 LF/0.5 ML INJ
INTRAMUSCULAR | Status: AC
Start: 2018-04-21 — End: 2018-04-21
  Administered 2018-04-21: 18:00:00 via INTRAMUSCULAR

## 2018-04-21 MED ORDER — AMOXICILLIN CLAVULANATE 875 MG-125 MG TAB
875-125 mg | ORAL_TABLET | Freq: Two times a day (BID) | ORAL | 0 refills | Status: AC
Start: 2018-04-21 — End: 2018-04-28

## 2018-04-21 MED FILL — BOOSTRIX TDAP 2.5 LF UNIT-8 MCG-5 LF/0.5 ML INTRAMUSCULAR SYRINGE: INTRAMUSCULAR | Qty: 0.5

## 2018-04-21 NOTE — ED Notes (Signed)
Patient (s)  given copy of dc instructions and 0 paper script(s) and 2 electronic scripts.  Patient (s)  verbalized understanding of instructions and script (s).  Patient given a current medication reconciliation form and verbalized understanding of their medications.   Patient (s) verbalized understanding of the importance of discussing medications with  his or her physician or clinic they will be following up with.  Patient alert and oriented and in no acute distress.  Patient offered wheelchair from treatment area to hospital entrance, patient declined wheelchair.     Pt does not want police or [employer] involvement.
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
## 2018-04-21 NOTE — ED Notes (Signed)
Patient (s)  given copy of dc instructions and 0 paper script(s) and 2 electronic scripts.  Patient (s)  verbalized understanding of instructions and script (s).  Patient given a current medication reconciliation form and verbalized understanding of their medications.   Patient (s) verbalized understanding of the importance of discussing medications with  his or her physician or clinic they will be following up with.  Patient alert and oriented and in no acute distress.  Patient offered wheelchair from treatment area to hospital entrance, patient declined wheelchair.     Pt does not want police or [employer] involvement.

## 2018-05-09 ENCOUNTER — Ambulatory Visit
Admit: 2018-05-09 | Discharge: 2018-05-09 | Payer: PRIVATE HEALTH INSURANCE | Attending: Psychiatric/Mental Health | Primary: Family Medicine

## 2018-05-09 ENCOUNTER — Ambulatory Visit: Attending: Psychiatric/Mental Health | Primary: Family Medicine

## 2018-05-09 DIAGNOSIS — F259 Schizoaffective disorder, unspecified: Secondary | ICD-10-CM

## 2018-05-09 MED ORDER — SERTRALINE 100 MG TAB
100 mg | ORAL_TABLET | ORAL | 0 refills | Status: DC
Start: 2018-05-09 — End: 2018-06-04

## 2018-05-09 MED ORDER — TRAZODONE 50 MG TAB
50 mg | ORAL_TABLET | ORAL | 0 refills | Status: DC
Start: 2018-05-09 — End: 2018-06-04

## 2018-05-09 MED ORDER — RISPERIDONE 0.5 MG TAB
0.5 mg | ORAL_TABLET | ORAL | 0 refills | Status: DC
Start: 2018-05-09 — End: 2018-06-04
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
## 2018-05-09 NOTE — Patient Instructions (Addendum)
Sleep Tips    What to avoid ??  ?? Do not have drinks with caffeine, such as coffee or black tea, for 8 hours before bed.  ?? Do not smoke or use other types of tobacco near bedtime. Nicotine is a stimulant and can keep you awake.  ?? Avoid drinking alcohol late in the evening, because it can cause you to wake in the middle of the night.  ?? Do not eat a big meal close to bedtime. If you are hungry, eat a light snack.  ?? Do not drink a lot of water close to bedtime, because the need to urinate may wake you up during the night.  ?? Do not read or watch TV in bed. Use the bed only for sleeping and sexual activity.  What to try ??  ?? Go to bed at the same time every night, and wake up at the same time every morning. Do not take naps during the day.  ?? Keep your bedroom quiet, dark, and cool.  ?? Get regular exercise, but not within 3 to 4 hours of your bedtime.  ?? Sleep on a comfortable pillow and mattress.  ?? If watching the clock makes you anxious, turn it facing away from you so you cannot see the time.  ?? If you worry when you lie down, start a worry book. Well before bedtime, write down your worries, and then set the book and your concerns aside.  ?? Try meditation or other relaxation techniques before you go to bed.  ?? If you cannot fall asleep, get up and go to another room until you feel sleepy. Do something relaxing. Repeat your bedtime routine before you go to bed again.  Make your house quiet and calm about an hour before bedtime. Turn down the lights, turn off the TV, log off the computer, and turn down the volume on music. This can help you relax after a busy day.     Learning About Schizoaffective Disorder  What is schizoaffective disorder?    Schizoaffective (say "skit-so-af-FECK-tiv") disorder is a complex mental illness. People who have it have the symptoms of both schizophrenia and a mood disorder.  The disorder affects how clearly you can think. It can also make it hard  to manage your emotions and connect with others. And it affects how happy or sad you feel.  What causes it?  Experts don't know what causes schizoaffective disorder. It may have different causes for different people. It's not caused by anything you did or how your parents raised you. And it's not a sign of weakness.  What are the symptoms?  The symptoms of schizoaffective disorder are the same as those of schizophrenia and a mood disorder.  People with schizoaffective disorder may have many of these symptoms.  Schizophrenia symptoms include:  Having hallucinations. This means that you see or hear things that aren't really there.  Having delusions. These are beliefs that aren't real.  Having a hard time feeling and showing emotion.  Mood disorder symptoms include:  Depression.  Feeling extremely happy or having lots of energy.  How is it diagnosed?  Your doctor or mental health professional usually can tell if you have schizoaffective disorder by talking with you. He or she will look at the order and timing of your symptoms and how long your symptoms last.  Your doctor will ask you about other things too. These may include questions about:  Any odd experiences you may have had, such as hearing  voices or having confusing thoughts.  Your feelings.  Any changes in eating habits, energy level, and interest in daily tasks.  How well you are sleeping.  If you can focus on the things you do.  How is it treated?  Finding out that you have schizoaffective disorder can be scary and hard to deal with. But the disorder can be treated.  The goal of treatment is to lower your stress and help your brain work as it should. Ongoing treatment can keep the disorder under control. Treatment includes medicines and counseling.  Medicines help your symptoms. It's important to take your medicines on schedule to keep your moods even. When you feel good, you may think that you don't need them. But it is important to keep taking them.   Counseling helps you change how you think about things. It can also help you cope with the illness. You will work with a Financial tradermental health professional. This may be a Warden/rangerpsychologist, a Research scientist (physical sciences)licensed professional counselor, a Visual merchandiserclinical social worker, or a psychiatrist.  Follow-up care is a key part of your treatment and safety. Be sure to make and go to all appointments, and call your doctor if you are having problems. It's also a good idea to know your test results and keep a list of the medicines you take.  Where can you learn more?  Go to InsuranceStats.cahttp://www.healthwise.net/GoodHelpConnections.  Enter A016 in the search box to learn more about "Learning About Schizoaffective Disorder."  Current as of: May 30, 2017  Content Version: 12.1  ?? 2006-2019 [employer]. Care instructions adapted under license by [employer] (which disclaims liability or warranty for this information). If you have questions about a medical condition or this instruction, always ask your healthcare professional. Healthwise, Incorporated disclaims any warranty or liability for your use of this information.    ??
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
## 2018-05-14 ENCOUNTER — Encounter

## 2018-06-04 ENCOUNTER — Ambulatory Visit
Admit: 2018-06-04 | Discharge: 2018-06-04 | Payer: PRIVATE HEALTH INSURANCE | Attending: Psychiatric/Mental Health | Primary: Family Medicine

## 2018-06-04 ENCOUNTER — Ambulatory Visit: Attending: Psychiatric/Mental Health | Primary: Family Medicine

## 2018-06-04 DIAGNOSIS — F259 Schizoaffective disorder, unspecified: Secondary | ICD-10-CM

## 2018-06-04 MED ORDER — RISPERIDONE 1 MG TAB
1 mg | ORAL_TABLET | Freq: Every evening | ORAL | 1 refills | Status: DC
Start: 2018-06-04 — End: 2018-07-29

## 2018-06-04 MED ORDER — SERTRALINE 100 MG TAB
100 mg | ORAL_TABLET | Freq: Every day | ORAL | 1 refills | Status: DC
Start: 2018-06-04 — End: 2018-07-29

## 2018-06-04 MED ORDER — TRAZODONE 50 MG TAB
50 mg | ORAL_TABLET | ORAL | 1 refills | Status: DC
Start: 2018-06-04 — End: 2018-07-29
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
## 2018-06-04 NOTE — Patient Instructions (Signed)
Sleep Tips    What to avoid ??  ?? Do not have drinks with caffeine, such as coffee or black tea, for 8 hours before bed.  ?? Do not smoke or use other types of tobacco near bedtime. Nicotine is a stimulant and can keep you awake.  ?? Avoid drinking alcohol late in the evening, because it can cause you to wake in the middle of the night.  ?? Do not eat a big meal close to bedtime. If you are hungry, eat a light snack.  ?? Do not drink a lot of water close to bedtime, because the need to urinate may wake you up during the night.  ?? Do not read or watch TV in bed. Use the bed only for sleeping and sexual activity.  What to try ??  ?? Go to bed at the same time every night, and wake up at the same time every morning. Do not take naps during the day.  ?? Keep your bedroom quiet, dark, and cool.  ?? Get regular exercise, but not within 3 to 4 hours of your bedtime.  ?? Sleep on a comfortable pillow and mattress.  ?? If watching the clock makes you anxious, turn it facing away from you so you cannot see the time.  ?? If you worry when you lie down, start a worry book. Well before bedtime, write down your worries, and then set the book and your concerns aside.  ?? Try meditation or other relaxation techniques before you go to bed.  ?? If you cannot fall asleep, get up and go to another room until you feel sleepy. Do something relaxing. Repeat your bedtime routine before you go to bed again.  ?? Make your house quiet and calm about an hour before bedtime. Turn down the lights, turn off the TV, log off the computer, and turn down the volume on music. This can help you relax after a busy day.
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
## 2018-07-29 ENCOUNTER — Encounter

## 2018-07-30 MED ORDER — RISPERIDONE 1 MG TAB
1 mg | ORAL_TABLET | ORAL | 0 refills | Status: DC
Start: 2018-07-30 — End: 2018-08-23

## 2018-07-30 MED ORDER — TRAZODONE 50 MG TAB
50 mg | ORAL_TABLET | ORAL | 0 refills | Status: DC
Start: 2018-07-30 — End: 2018-08-23

## 2018-07-30 MED ORDER — SERTRALINE 100 MG TAB
100 mg | ORAL_TABLET | ORAL | 0 refills | Status: DC
Start: 2018-07-30 — End: 2018-08-23

## 2018-08-23 ENCOUNTER — Ambulatory Visit: Admit: 2018-08-23 | Payer: PRIVATE HEALTH INSURANCE | Attending: Psychiatric/Mental Health | Primary: Family Medicine

## 2018-08-23 ENCOUNTER — Ambulatory Visit: Attending: Psychiatric/Mental Health | Primary: Family Medicine

## 2018-08-23 DIAGNOSIS — F259 Schizoaffective disorder, unspecified: Secondary | ICD-10-CM

## 2018-08-23 MED ORDER — TRAZODONE 50 MG TAB
50 mg | ORAL_TABLET | Freq: Every evening | ORAL | 2 refills | Status: AC | PRN
Start: 2018-08-23 — End: ?

## 2018-08-23 MED ORDER — SERTRALINE 100 MG TAB
100 mg | ORAL_TABLET | ORAL | 2 refills | Status: AC
Start: 2018-08-23 — End: ?

## 2018-08-23 MED ORDER — RISPERIDONE 1 MG TAB
1 mg | ORAL_TABLET | ORAL | 2 refills | Status: AC
Start: 2018-08-23 — End: ?

## 2018-08-23 NOTE — Progress Notes (Signed)
 Traci [name] is a 52 y.o. female    Chief Complaint   Patient presents with   . Mental Health Problem     Blood pressure (!) 129/100, pulse (!) 104, height 5' 6 (1.676 m), weight 208 lb (94.3 kg), SpO2 98 %.    1. Have you been to the ER, urgent care clinic since your last visit?  Hospitalized since your last visit? No    2. Have you seen or consulted any other health care providers outside of the [facility] since your last visit?  Include any pap smears or colon screening.  No
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
## 2018-08-23 NOTE — Progress Notes (Signed)
Traci [name] is a 52 y.o. female    Chief Complaint   Patient presents with   ??? Mental Health Problem     Blood pressure (!) 129/100, pulse (!) 104, height 5\' 6"  (1.676 m), weight 208 lb (94.3 kg), SpO2 98 %.    1. Have you been to the ER, urgent care clinic since your last visit?  Hospitalized since your last visit? No    2. Have you seen or consulted any other health care providers outside of the [facility] since your last visit?  Include any pap smears or colon screening.  No
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
## 2018-08-23 NOTE — Patient Instructions (Signed)
Sleep Tips    What to avoid ??  ?? Do not have drinks with caffeine, such as coffee or black tea, for 8 hours before bed.  ?? Do not smoke or use other types of tobacco near bedtime. Nicotine is a stimulant and can keep you awake.  ?? Avoid drinking alcohol late in the evening, because it can cause you to wake in the middle of the night.  ?? Do not eat a big meal close to bedtime. If you are hungry, eat a light snack.  ?? Do not drink a lot of water close to bedtime, because the need to urinate may wake you up during the night.  ?? Do not read or watch TV in bed. Use the bed only for sleeping and sexual activity.  What to try ??  ?? Go to bed at the same time every night, and wake up at the same time every morning. Do not take naps during the day.  ?? Keep your bedroom quiet, dark, and cool.  ?? Get regular exercise, but not within 3 to 4 hours of your bedtime.  ?? Sleep on a comfortable pillow and mattress.  ?? If watching the clock makes you anxious, turn it facing away from you so you cannot see the time.  ?? If you worry when you lie down, start a worry book. Well before bedtime, write down your worries, and then set the book and your concerns aside.  ?? Try meditation or other relaxation techniques before you go to bed.  ?? If you cannot fall asleep, get up and go to another room until you feel sleepy. Do something relaxing. Repeat your bedtime routine before you go to bed again.  ?? Make your house quiet and calm about an hour before bedtime. Turn down the lights, turn off the TV, log off the computer, and turn down the volume on music. This can help you relax after a busy day.

## 2019-06-19 ENCOUNTER — Telehealth: Payer: Self-pay | Admitting: Infectious Disease

## 2019-06-19 NOTE — Telephone Encounter (Signed)
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  They are interested enrolling her in a study  Regardless it would be helpful if she could get reengaged in care as there is data that treatment of elite controllers with antivirals may be beneficial to them

## 2019-06-19 NOTE — Telephone Encounter (Signed)
Thanks so much I figured they might be out of date.  I wonder if she has a valid mailing address or if she is moved?

## 2019-06-19 NOTE — Telephone Encounter (Signed)
Unable [email or web address] reach patient. Both phone number listed are not valid.  Jacqueline Stevenson

## 2020-05-21 ENCOUNTER — Encounter

## 2020-05-26 ENCOUNTER — Inpatient Hospital Stay: Payer: MEDICAID | Attending: Family Medicine | Primary: Family Medicine

## 2020-06-01 ENCOUNTER — Inpatient Hospital Stay: Admit: 2020-06-01 | Payer: MEDICAID | Attending: Family Medicine | Primary: Family Medicine

## 2020-06-01 DIAGNOSIS — Z1231 Encounter for screening mammogram for malignant neoplasm of breast: Secondary | ICD-10-CM

## 2021-08-30 ENCOUNTER — Encounter

## 2021-09-02 ENCOUNTER — Inpatient Hospital Stay: Payer: MEDICAID | Attending: Family Medicine | Primary: Family Medicine

## 2022-01-08 ENCOUNTER — Encounter
